# Patient Record
Sex: Female | Born: 1935 | Race: Black or African American | Hispanic: No | Marital: Single
Health system: Southern US, Community
[De-identification: ages and names within clinical notes are randomized; demographics above are authoritative.]

## PROBLEM LIST (undated history)

## (undated) DIAGNOSIS — I10 Essential (primary) hypertension: Secondary | ICD-10-CM

## (undated) DIAGNOSIS — Z8719 Personal history of other diseases of the digestive system: Secondary | ICD-10-CM

## (undated) DIAGNOSIS — Z8711 Personal history of peptic ulcer disease: Secondary | ICD-10-CM

## (undated) HISTORY — PX: CARPAL TUNNEL RELEASE: SHX101

---

## 1998-11-08 ENCOUNTER — Other Ambulatory Visit: Admission: RE | Admit: 1998-11-08 | Discharge: 1998-11-08 | Payer: Self-pay | Admitting: Otolaryngology

## 1999-04-20 ENCOUNTER — Emergency Department (HOSPITAL_COMMUNITY): Admission: EM | Admit: 1999-04-20 | Discharge: 1999-04-20 | Payer: Self-pay | Admitting: Emergency Medicine

## 2003-09-30 ENCOUNTER — Emergency Department (HOSPITAL_COMMUNITY): Admission: EM | Admit: 2003-09-30 | Discharge: 2003-10-01 | Payer: Self-pay | Admitting: Emergency Medicine

## 2003-10-10 ENCOUNTER — Ambulatory Visit (HOSPITAL_COMMUNITY): Admission: RE | Admit: 2003-10-10 | Discharge: 2003-10-10 | Payer: Self-pay | Admitting: Chiropractic Medicine

## 2003-10-10 ENCOUNTER — Encounter: Payer: Self-pay | Admitting: Chiropractic Medicine

## 2004-01-29 ENCOUNTER — Emergency Department (HOSPITAL_COMMUNITY): Admission: EM | Admit: 2004-01-29 | Discharge: 2004-01-29 | Payer: Self-pay | Admitting: Family Medicine

## 2004-02-07 ENCOUNTER — Encounter: Admission: RE | Admit: 2004-02-07 | Discharge: 2004-02-07 | Payer: Self-pay | Admitting: Nephrology

## 2004-10-24 ENCOUNTER — Emergency Department (HOSPITAL_COMMUNITY): Admission: EM | Admit: 2004-10-24 | Discharge: 2004-10-24 | Payer: Self-pay | Admitting: Family Medicine

## 2005-11-20 ENCOUNTER — Emergency Department (HOSPITAL_COMMUNITY): Admission: EM | Admit: 2005-11-20 | Discharge: 2005-11-20 | Payer: Self-pay | Admitting: Family Medicine

## 2006-01-12 ENCOUNTER — Emergency Department (HOSPITAL_COMMUNITY): Admission: EM | Admit: 2006-01-12 | Discharge: 2006-01-12 | Payer: Self-pay | Admitting: Family Medicine

## 2007-09-07 ENCOUNTER — Emergency Department (HOSPITAL_COMMUNITY): Admission: EM | Admit: 2007-09-07 | Discharge: 2007-09-07 | Payer: Self-pay | Admitting: Emergency Medicine

## 2009-07-20 ENCOUNTER — Emergency Department (HOSPITAL_COMMUNITY): Admission: EM | Admit: 2009-07-20 | Discharge: 2009-07-21 | Payer: Self-pay | Admitting: Emergency Medicine

## 2009-11-15 ENCOUNTER — Emergency Department (HOSPITAL_COMMUNITY): Admission: EM | Admit: 2009-11-15 | Discharge: 2009-11-16 | Payer: Self-pay | Admitting: Emergency Medicine

## 2011-04-02 LAB — CBC
Hemoglobin: 12.9 g/dL (ref 12.0–15.0)
MCHC: 33.6 g/dL (ref 30.0–36.0)
RDW: 12.7 % (ref 11.5–15.5)

## 2011-04-02 LAB — COMPREHENSIVE METABOLIC PANEL
ALT: 14 U/L (ref 0–35)
Calcium: 9.3 mg/dL (ref 8.4–10.5)
Creatinine, Ser: 0.93 mg/dL (ref 0.4–1.2)
GFR calc Af Amer: >60 mL/min (ref >60)
Glucose, Bld: 137 mg/dL — ABNORMAL HIGH (ref 70–99)
Sodium: 139 mEq/L (ref 135–145)
Total Protein: 7.9 g/dL (ref 6.0–8.3)

## 2011-04-02 LAB — URINE MICROSCOPIC-ADD ON

## 2011-04-02 LAB — DIFFERENTIAL
Eosinophils Absolute: 0.1 10*3/uL (ref 0.0–0.7)
Lymphocytes Relative: 5 % — ABNORMAL LOW (ref 12–46)
Lymphs Abs: 0.4 10*3/uL — ABNORMAL LOW (ref 0.7–4.0)
Monocytes Relative: 3 % (ref 3–12)
Neutrophils Relative %: 91 % — ABNORMAL HIGH (ref 43–77)

## 2011-04-02 LAB — URINALYSIS, ROUTINE W REFLEX MICROSCOPIC
Glucose, UA: NEGATIVE mg/dL
Leukocytes, UA: NEGATIVE
Protein, ur: NEGATIVE mg/dL
Specific Gravity, Urine: 1.017 (ref 1.005–1.030)
pH: 5 (ref 5.0–8.0)

## 2011-04-06 LAB — URINALYSIS, ROUTINE W REFLEX MICROSCOPIC
Ketones, ur: NEGATIVE mg/dL
Protein, ur: NEGATIVE mg/dL
Urobilinogen, UA: 0.2 mg/dL (ref 0.0–1.0)

## 2011-04-06 LAB — DIFFERENTIAL
Basophils Relative: 0 % (ref 0–1)
Eosinophils Absolute: 0 10*3/uL (ref 0.0–0.7)
Lymphs Abs: 0.7 10*3/uL (ref 0.7–4.0)
Monocytes Absolute: 0.2 10*3/uL (ref 0.1–1.0)
Monocytes Relative: 4 % (ref 3–12)
Neutro Abs: 4.1 10*3/uL (ref 1.7–7.7)

## 2011-04-06 LAB — POCT I-STAT, CHEM 8
Calcium, Ion: 1.13 mmol/L (ref 1.12–1.32)
Chloride: 102 mEq/L (ref 96–112)
Creatinine, Ser: 1.1 mg/dL (ref 0.4–1.2)
Glucose, Bld: 106 mg/dL — ABNORMAL HIGH (ref 70–99)
HCT: 34 % — ABNORMAL LOW (ref 36.0–46.0)
Potassium: 3.7 mEq/L (ref 3.5–5.1)

## 2011-04-06 LAB — CBC
Hemoglobin: 11.4 g/dL — ABNORMAL LOW (ref 12.0–15.0)
MCHC: 33.6 g/dL (ref 30.0–36.0)
MCV: 88.9 fL (ref 78.0–100.0)
RBC: 3.82 MIL/uL — ABNORMAL LOW (ref 3.87–5.11)
WBC: 5.1 10*3/uL (ref 4.0–10.5)

## 2011-04-06 LAB — URINE MICROSCOPIC-ADD ON

## 2012-05-14 ENCOUNTER — Encounter (HOSPITAL_COMMUNITY): Payer: Self-pay | Admitting: Emergency Medicine

## 2012-05-14 ENCOUNTER — Emergency Department (INDEPENDENT_AMBULATORY_CARE_PROVIDER_SITE_OTHER)
Admission: EM | Admit: 2012-05-14 | Discharge: 2012-05-14 | Disposition: A | Discharge status: Home / Self Care | Payer: Medicare Other | Source: Home / Self Care | Attending: Family Medicine | Admitting: Family Medicine

## 2012-05-14 DIAGNOSIS — S91209A Unspecified open wound of unspecified toe(s) with damage to nail, initial encounter: Secondary | ICD-10-CM

## 2012-05-14 DIAGNOSIS — I10 Essential (primary) hypertension: Secondary | ICD-10-CM

## 2012-05-14 DIAGNOSIS — S91109A Unspecified open wound of unspecified toe(s) without damage to nail, initial encounter: Secondary | ICD-10-CM

## 2012-05-14 LAB — POCT I-STAT, CHEM 8
BUN: 19 mg/dL (ref 6–23)
Calcium, Ion: 1.22 mmol/L (ref 1.12–1.32)
Chloride: 102 mEq/L (ref 96–112)
Glucose, Bld: 94 mg/dL (ref 70–99)
TCO2: 30 mmol/L (ref 0–100)

## 2012-05-14 MED ORDER — HYDROCHLOROTHIAZIDE 12.5 MG PO TABS: 12.5000 mg | ORAL_TABLET | Freq: Every day | ORAL | Status: DC

## 2012-05-14 MED ORDER — ACETAMINOPHEN-CODEINE #3 300-30 MG PO TABS: 1.0000 | ORAL_TABLET | Freq: Four times a day (QID) | ORAL | Status: AC | PRN

## 2012-05-14 MED ORDER — DOXYCYCLINE HYCLATE 100 MG PO CAPS: 100.0000 mg | ORAL_CAPSULE | Freq: Two times a day (BID) | ORAL | Status: AC

## 2012-05-14 NOTE — ED Notes (Signed)
First set of vitals obtained by [employer] student

## 2012-05-14 NOTE — ED Notes (Signed)
Pt caught her toenail on a door jam and it was almost pulled off. No c/o pain. Pt BP is elevated, she states this is a chronic problem but doesn't take anything for it.

## 2012-05-14 NOTE — ED Provider Notes (Signed)
History     CSN: [id number]  Arrival date & time 05/14/12  4098   First MD Initiated Contact with Patient 05/14/12 1938      Chief Complaint  Patient presents with  . Toe Injury    (Consider location/radiation/quality/duration/timing/severity/associated sxs/prior treatment) HPI Comments: 78 year old female with no significant past medical history. Here complaining of pain and bleeding from left toenail. She has thick and deformed toenails and hit her left toenail against the door earlier today causing bleeding and pain. Blood pressure found elevated here she has been told before her blood pressure is high has never taken any medication last time she saw her primary care provider was one year ago denies headache or visual changes. Denies chest pain or shortness of breath. No difficulties with gait or balance no extremity weakness or paresthesias. No leg swelling or PND.    History reviewed. No pertinent past medical history.  History reviewed. No pertinent past surgical history.  History reviewed. No pertinent family history.  History  Substance Use Topics  . Smoking status: Never Smoker   . Smokeless tobacco: Not on file  . Alcohol Use: No    OB History    Grav Para Term Preterm Abortions TAB SAB Ect Mult Living                  Review of Systems  Constitutional: Negative for fever, chills and diaphoresis.       10 systems reviewed and  pertinent negative and positive symptoms and as per HPI.     Respiratory: Negative for cough, chest tightness and shortness of breath.   Cardiovascular: Negative for chest pain, palpitations and leg swelling.  Gastrointestinal: Negative for nausea and abdominal pain.  Musculoskeletal: Negative for back pain.  Skin: Negative for rash.       Left big toenail avulsion as per HPI  Neurological: Negative for dizziness, seizures, syncope, weakness, numbness and headaches.  All other systems reviewed and are negative.    Allergies    Aspirin  Home Medications   Current Outpatient Rx  Name Route Sig Dispense Refill  . ACETAMINOPHEN-CODEINE #3 300-30 MG PO TABS Oral Take 1-2 tablets by mouth every 6 (six) hours as needed for pain. 15 tablet 0  . DOXYCYCLINE HYCLATE 100 MG PO CAPS Oral Take 1 capsule (100 mg total) by mouth 2 (two) times daily. 14 capsule 0  . HYDROCHLOROTHIAZIDE 12.5 MG PO TABS Oral Take 1 tablet (12.5 mg total) by mouth daily. 30 tablet 0    BP 192/71  Pulse 79  Temp(Src) 98.1 F (36.7 C) (Oral)  Resp 12  SpO2 100%  Physical Exam  Nursing note and vitals reviewed. Constitutional: She is oriented to person, place, and time. She appears well-developed and well-nourished. No distress.  HENT:  Head: Normocephalic and atraumatic.  Mouth/Throat: Oropharynx is clear and moist.  Eyes: Conjunctivae and EOM are normal. Pupils are equal, round, and reactive to light. No scleral icterus.  Neck: Neck supple. No JVD present. No thyromegaly present.  Cardiovascular: Normal rate, regular rhythm, normal heart sounds and intact distal pulses.   Pulmonary/Chest: Effort normal and breath sounds normal. No respiratory distress. She has no wheezes. She has no rales.  Abdominal: Soft. There is no tenderness.  Neurological: She is alert and oriented to person, place, and time.  Skin: No rash noted. She is not diaphoretic.       Partially avulsed left toe nail with mild bleeding.      ED Course  NAIL REMOVAL Performed by: Sharin Grave Authorized by: Sharin Grave Consent: Verbal consent obtained. Consent given by: patient Patient understanding: patient states understanding of the procedure being performed Patient consent: the patient's understanding of the procedure matches consent given Location: left foot Anesthesia: digital block Local anesthetic: lidocaine 2% without epinephrine Anesthetic total: 3 ml Preparation: skin prepped with Betadine and sterile field established Amount removed:  complete Nail bed sutured: no Dressing: antibiotic ointment and dressing applied Patient tolerance: Patient tolerated the procedure well with no immediate complications.   (including critical care time)   Labs Reviewed  POCT I-STAT, CHEM 8   No results found.   1. Hypertension   2. Nail avulsion, toe       MDM  Partially avulsed left toe nail removed. Uncontrolled hypertension, asymptomatic. Pt desires to start treatment. I stat with normal electrolytes. Stable borderline creatinine and normal but rising BUN compared with I- stat 2 years ago.  EKG: with signs of LVH, NSR, normal rate in 70's and no acute ischemic changes. Started low dose HCTZ 12.5 mg daily. Asked to follow up with PCP during next week for BP monitoring, explained she will need fasting labs for cholesterol screening etc.  Asked to return in 2-3 days for wound recheck.        Sharin Grave, MD 05/15/12 0530

## 2012-05-14 NOTE — Discharge Instructions (Signed)
Keep wound clean and dry. Can remove dressing tomorrow. Can apply antibiotic ointment before covering. Can follow handout instructions. Take the prescribed medications as instructed. Be aware that Tylenol #3 can make you feel drowsy putting your risk for falls and he should not drive after taking this medication. Return in 48 -72 hours for recheck. Return earlier if swelling pain or drainage. You need to see your primary care provider next week for blood pressure monitoring.

## 2012-05-17 ENCOUNTER — Emergency Department (INDEPENDENT_AMBULATORY_CARE_PROVIDER_SITE_OTHER)
Admission: EM | Admit: 2012-05-17 | Discharge: 2012-05-17 | Disposition: A | Discharge status: Home / Self Care | Payer: Medicare Other | Source: Home / Self Care | Attending: Family Medicine | Admitting: Family Medicine

## 2012-05-17 ENCOUNTER — Encounter (HOSPITAL_COMMUNITY): Payer: Self-pay | Admitting: Emergency Medicine

## 2012-05-17 DIAGNOSIS — Z4800 Encounter for change or removal of nonsurgical wound dressing: Secondary | ICD-10-CM

## 2012-05-17 DIAGNOSIS — IMO0001 Reserved for inherently not codable concepts without codable children: Secondary | ICD-10-CM

## 2012-05-17 HISTORY — DX: Essential (primary) hypertension: I10

## 2012-05-17 HISTORY — DX: Personal history of peptic ulcer disease: Z87.11

## 2012-05-17 HISTORY — DX: Personal history of other diseases of the digestive system: Z87.19

## 2012-05-17 NOTE — Discharge Instructions (Signed)
Continue to soak and dressing changes. Follow up if any complications.

## 2012-05-17 NOTE — ED Provider Notes (Signed)
History     CSN: [id number]  Arrival date & time 05/17/12  1191   First MD Initiated Contact with Patient 05/17/12 [phone]      Chief Complaint  Patient presents with  . Wound Check    (Consider location/radiation/quality/duration/timing/severity/associated sxs/prior treatment) HPI Comments: Patient here for a wound check . Had her left great toe nail removed Friday. Taking her antibiotic. States feels much better. No complaints.   The history is provided by the patient.    Past Medical History  Diagnosis Date  . Hypertension   . History of stomach ulcers     History reviewed. No pertinent past surgical history.  History reviewed. No pertinent family history.  History  Substance Use Topics  . Smoking status: Never Smoker   . Smokeless tobacco: Not on file  . Alcohol Use: No    OB History    Grav Para Term Preterm Abortions TAB SAB Ect Mult Living                  Review of Systems  Constitutional: Negative.   Respiratory: Negative.   Cardiovascular: Negative.     Allergies  Aspirin  Home Medications   Current Outpatient Rx  Name Route Sig Dispense Refill  . ACETAMINOPHEN-CODEINE #3 300-30 MG PO TABS Oral Take 1-2 tablets by mouth every 6 (six) hours as needed for pain. 15 tablet 0  . DOXYCYCLINE HYCLATE 100 MG PO CAPS Oral Take 1 capsule (100 mg total) by mouth 2 (two) times daily. 14 capsule 0  . HYDROCHLOROTHIAZIDE 12.5 MG PO TABS Oral Take 1 tablet (12.5 mg total) by mouth daily. 30 tablet 0    BP 184/96  Pulse 96  Temp(Src) 98.9 F (37.2 C) (Oral)  Resp 14  SpO2 99%  Physical Exam  Nursing note and vitals reviewed. Constitutional: She appears well-developed and well-nourished.       bp still up . Is taking her hctz since fri.  HENT:  Head: Normocephalic and atraumatic.  Cardiovascular: Normal rate.   Pulmonary/Chest: Effort normal and breath sounds normal.  Musculoskeletal:       eval of the left toe reveals the nail to be absent. No  swelling. No erythema. rom intact. Wound redressed with neosporin ointment.     ED Course  Procedures (including critical care time)  Labs Reviewed - No data to display No results found.   1. Wound check, dressing change       MDM          Randa Spike, MD 05/17/12 1023

## 2012-05-17 NOTE — ED Notes (Signed)
Toenail removed on Friday- here for recheck

## 2014-01-06 ENCOUNTER — Ambulatory Visit: Payer: Self-pay | Admitting: Podiatrist

## 2014-01-18 ENCOUNTER — Ambulatory Visit: Payer: Self-pay | Admitting: Podiatrist

## 2014-02-03 ENCOUNTER — Ambulatory Visit: Payer: Self-pay | Admitting: Podiatrist

## 2014-02-10 ENCOUNTER — Ambulatory Visit: Payer: Self-pay | Admitting: Podiatrist

## 2014-05-16 ENCOUNTER — Encounter: Payer: Self-pay | Admitting: Podiatry

## 2014-05-16 ENCOUNTER — Ambulatory Visit (INDEPENDENT_AMBULATORY_CARE_PROVIDER_SITE_OTHER): Payer: Medicare Other | Admitting: Podiatry

## 2014-05-16 VITALS — BP 121/85 | HR 84 | Resp 18 | Ht 66.0 in | Wt 128.0 lb

## 2014-05-16 DIAGNOSIS — M79609 Pain in unspecified limb: Secondary | ICD-10-CM

## 2014-05-16 DIAGNOSIS — B351 Tinea unguium: Secondary | ICD-10-CM

## 2014-05-16 NOTE — Progress Notes (Signed)
   Subjective:    Patient ID: Sharon Coleman, female    DOB: 07-Aug-1931, 80 y.o.   MRN: [id number]  HPI Comments: Problems with  These toenails all my life. They are thick painful and discolored .      Review of Systems  All other systems reviewed and are negative.      Objective:   Physical Exam have reviewed her past medical history medications allergies surgeries social history and review of systems. Pulses are strongly palpable neurologic sensorium is intact per Semmes-Weinstein monofilaments deep tendon reflexes are intact bilateral muscle strength is 5 over 5 dorsiflexors plantar flexors inverters everters onto the musculature is intact. Orthopedic evaluation demonstrates hammertoe deformities with distal clavi to the third digit bilateral. Cutaneous evaluation demonstrates supple well hydrated cutis nails are thick yellow dystrophic onychomycotic distal clavi third digit bilateral. No signs of infection. Thick yellow dystrophic lytic mycotic nails with painful on palpation and debridement.        Assessment & Plan:  Assessment: Hammertoe deformities with distal clavi third bilateral. Pain in limb secondary to onychomycosis 1 through 5 bilateral.  Plan: Debridement of all reactive hyperkeratosis. Debridement of nails in thickness and length as a covered service secondary to pain. Placed padding beneath the toes.

## 2014-07-04 ENCOUNTER — Encounter: Payer: Self-pay | Admitting: Podiatry

## 2014-07-04 ENCOUNTER — Ambulatory Visit (INDEPENDENT_AMBULATORY_CARE_PROVIDER_SITE_OTHER): Payer: Medicare Other | Admitting: Podiatry

## 2014-07-04 VITALS — BP 170/80 | HR 78 | Resp 16

## 2014-07-04 DIAGNOSIS — M779 Enthesopathy, unspecified: Principal | ICD-10-CM

## 2014-07-04 DIAGNOSIS — M778 Other enthesopathies, not elsewhere classified: Secondary | ICD-10-CM

## 2014-07-04 DIAGNOSIS — M7750 Other enthesopathy of unspecified foot: Secondary | ICD-10-CM

## 2014-07-04 NOTE — Progress Notes (Signed)
She presents today still complaining of her third toes bilateral.  Objective: Vital signs are stable she is alert and oriented x3. Pulses are palpable bilateral. Hammertoe deformity results and a distal clavus third digit bilateral. No ulcerations are noted. She does have pain on palpation of the PIPJ and DIPJ. This is associated with osteoarthritis.  Assessment: Capsulitis with hammertoe deformities and distal clavus third digit bilateral osteoarthritis also had a hammertoe deformity third digit bilateral.  Plan: Debridement reactive hyperkeratosis distal aspect third toe bilateral also injected with dexamethasone and local anesthetic third digit bilateral.

## 2014-07-12 ENCOUNTER — Telehealth: Payer: Self-pay | Admitting: *Deleted

## 2014-07-12 NOTE — Telephone Encounter (Signed)
I'm calling regarding my mother.  She was in there last week.   She was given injections in her feet and they don't seem to be helping.  Is there something else that can be done?  Should she have x-rays taken of her feet to see if there's a problem? I told him his mother has Osteoarthritis in her feet.  The only option to correct that is surgery and I don't know if Dr. Irving ShowsEgerton will okay it.  He asked why.  I told him it depends on his mother's health, is she stable, able to get around.  He stated yes, my mother is in perfect health.  She works a third shift job.  I told him she can come in for a consultation and see what Dr. Irving ShowsEgerton suggests.  He stated that will be good.  I transferred him to a scheduler to make an appointment.

## 2014-07-27 ENCOUNTER — Encounter: Payer: Self-pay | Admitting: Podiatry

## 2014-07-27 ENCOUNTER — Encounter: Payer: Self-pay | Admitting: *Deleted

## 2014-07-27 ENCOUNTER — Ambulatory Visit (INDEPENDENT_AMBULATORY_CARE_PROVIDER_SITE_OTHER): Payer: Medicare Other | Admitting: Podiatry

## 2014-07-27 VITALS — BP 154/68 | HR 80 | Resp 16

## 2014-07-27 DIAGNOSIS — M779 Enthesopathy, unspecified: Principal | ICD-10-CM

## 2014-07-27 DIAGNOSIS — B351 Tinea unguium: Secondary | ICD-10-CM

## 2014-07-27 DIAGNOSIS — L608 Other nail disorders: Secondary | ICD-10-CM

## 2014-07-27 DIAGNOSIS — M7750 Other enthesopathy of unspecified foot: Secondary | ICD-10-CM

## 2014-07-27 DIAGNOSIS — M778 Other enthesopathies, not elsewhere classified: Secondary | ICD-10-CM

## 2014-07-27 NOTE — Progress Notes (Signed)
She presents today for followup of her hammertoes third digits bilaterally. She also concerned about thickness of her toenails being painful.  Objective: Vital signs are stable she is alert and oriented x3. No pain on palpation third toe of the left foot however third toes right foot is exquisitely painful on palpation. Is no erythema edema cellulitis drainage or odor. Distal clavus is noted. Her nails are thick yellow dystrophic possibly mycotic there does not appear to be fungal infection to the skin.  Assessment: Hammertoe deformities with capsulitis third metatarsophalangeal joint of the right foot. Hammertoe deformity also noted on the left foot without pain. Nail dystrophy 1 through 5 bilaterally.  Plan: Samples of the nail was taken today. I also injected the third metatarsophalangeal joint area with dexamethasone and local anesthetic. Discussed appropriate shoe gear stretching exercises and ice therapy.

## 2014-07-27 NOTE — Progress Notes (Signed)
[facility]

## 2014-08-15 ENCOUNTER — Encounter: Payer: Self-pay | Admitting: Podiatry

## 2014-08-17 ENCOUNTER — Ambulatory Visit: Payer: Medicare Other | Admitting: Podiatry

## 2014-09-05 ENCOUNTER — Encounter: Payer: Self-pay | Admitting: Podiatry

## 2014-09-05 ENCOUNTER — Ambulatory Visit (INDEPENDENT_AMBULATORY_CARE_PROVIDER_SITE_OTHER): Payer: Medicare Other | Admitting: Podiatry

## 2014-09-05 ENCOUNTER — Ambulatory Visit (INDEPENDENT_AMBULATORY_CARE_PROVIDER_SITE_OTHER): Payer: Medicare Other

## 2014-09-05 DIAGNOSIS — M2040 Other hammer toe(s) (acquired), unspecified foot: Secondary | ICD-10-CM

## 2014-09-05 DIAGNOSIS — M2041 Other hammer toe(s) (acquired), right foot: Secondary | ICD-10-CM

## 2014-09-05 DIAGNOSIS — Z79899 Other long term (current) drug therapy: Secondary | ICD-10-CM

## 2014-09-05 DIAGNOSIS — Q828 Other specified congenital malformations of skin: Secondary | ICD-10-CM

## 2014-09-05 LAB — HEPATIC FUNCTION PANEL
ALT: 18 U/L (ref 0–35)
AST: 35 U/L (ref 0–37)
Albumin: 4.4 g/dL (ref 3.5–5.2)
Alkaline Phosphatase: 67 U/L (ref 39–117)
BILIRUBIN DIRECT: 0.1 mg/dL (ref 0.0–0.3)
BILIRUBIN INDIRECT: 0.4 mg/dL (ref 0.2–1.2)
Total Bilirubin: 0.5 mg/dL (ref 0.2–1.2)
Total Protein: 7.1 g/dL (ref 6.0–8.3)

## 2014-09-05 LAB — CBC WITH DIFFERENTIAL/PLATELET
Basophils Absolute: 0 10*3/uL (ref 0.0–0.1)
Basophils Relative: 0 % (ref 0–1)
EOS ABS: 0.1 10*3/uL (ref 0.0–0.7)
EOS PCT: 4 % (ref 0–5)
HEMATOCRIT: 30 % — AB (ref 36.0–46.0)
HEMOGLOBIN: 10.3 g/dL — AB (ref 12.0–15.0)
LYMPHS ABS: 1 10*3/uL (ref 0.7–4.0)
Lymphocytes Relative: 28 % (ref 12–46)
MCH: 29.3 pg (ref 26.0–34.0)
MCHC: 34.3 g/dL (ref 30.0–36.0)
MCV: 85.5 fL (ref 78.0–100.0)
MONOS PCT: 7 % (ref 3–12)
Monocytes Absolute: 0.3 10*3/uL (ref 0.1–1.0)
Neutro Abs: 2.2 10*3/uL (ref 1.7–7.7)
Neutrophils Relative %: 61 % (ref 43–77)
Platelets: 204 10*3/uL (ref 150–400)
RBC: 3.51 MIL/uL — AB (ref 3.87–5.11)
RDW: 14 % (ref 11.5–15.5)
WBC: 3.6 10*3/uL — ABNORMAL LOW (ref 4.0–10.5)

## 2014-09-05 MED ORDER — TERBINAFINE HCL 250 MG PO TABS: 250.0000 mg | ORAL_TABLET | Freq: Every day | ORAL | Status: DC

## 2014-09-05 NOTE — Progress Notes (Signed)
She presents today chief complaint of pain to the third digit of the right foot. As well as to review her positive fungal culture.  Objective: Vital signs are stable she is alert and oriented x3. Positive fungal culture. Hammertoe deformity with severe bunion deformity right foot. Distal clavus third digit right foot. This is consistent with her hammertoe deformities.  Assessment: Painful porokeratotic lesion her distal clavus third digit right foot secondary hammertoe deformities. Also positive onychomycosis.  Plan: Discussed etiology pathology conservative versus surgical therapies. Started her on Lamisil 250 mg tablets 1 by mouth daily #30. Should she have questions or concerns she will notify us immediately we did discuss the pros and cons of Lamisil therapy today we also send her for liver profile and CBC. Should this come back abnormal I will notify her immediately. Otherwise I debrided the reactive hyperkeratosis today discuss the need for surgical intervention. We will discuss surgery for the third toe consisting of an arthroplasty PIPJ and DIPJ next visit.

## 2014-09-08 ENCOUNTER — Telehealth: Payer: Self-pay | Admitting: *Deleted

## 2014-09-08 NOTE — Telephone Encounter (Signed)
Message copied by Bing Ree on Fri Sep 08, 2014  9:07 AM ------      Message from: Ernestene Kiel T      Created: Wed Sep 06, 2014 12:03 PM       Blood work looks good. Continue medication. ------

## 2014-09-08 NOTE — Telephone Encounter (Signed)
Blood work looks good continue with medication  

## 2014-10-03 ENCOUNTER — Ambulatory Visit: Payer: Medicare Other | Admitting: Podiatry

## 2014-10-06 ENCOUNTER — Emergency Department (HOSPITAL_COMMUNITY): Payer: Medicare Other

## 2014-10-06 ENCOUNTER — Emergency Department (HOSPITAL_COMMUNITY)
Admission: EM | Admit: 2014-10-06 | Discharge: 2014-10-06 | Disposition: A | Discharge status: Home / Self Care | Payer: Medicare Other | Attending: Emergency Medicine | Admitting: Emergency Medicine

## 2014-10-06 ENCOUNTER — Encounter (HOSPITAL_COMMUNITY): Payer: Self-pay | Admitting: Emergency Medicine

## 2014-10-06 DIAGNOSIS — Z8719 Personal history of other diseases of the digestive system: Secondary | ICD-10-CM | POA: Insufficient documentation

## 2014-10-06 DIAGNOSIS — M2011 Hallux valgus (acquired), right foot: Secondary | ICD-10-CM | POA: Insufficient documentation

## 2014-10-06 DIAGNOSIS — S93502A Unspecified sprain of left great toe, initial encounter: Secondary | ICD-10-CM | POA: Insufficient documentation

## 2014-10-06 DIAGNOSIS — Y9289 Other specified places as the place of occurrence of the external cause: Secondary | ICD-10-CM | POA: Insufficient documentation

## 2014-10-06 DIAGNOSIS — W228XXA Striking against or struck by other objects, initial encounter: Secondary | ICD-10-CM | POA: Insufficient documentation

## 2014-10-06 DIAGNOSIS — Z79899 Other long term (current) drug therapy: Secondary | ICD-10-CM | POA: Diagnosis not present

## 2014-10-06 DIAGNOSIS — Y9301 Activity, walking, marching and hiking: Secondary | ICD-10-CM | POA: Diagnosis not present

## 2014-10-06 DIAGNOSIS — Z880 Allergy status to penicillin: Secondary | ICD-10-CM | POA: Insufficient documentation

## 2014-10-06 DIAGNOSIS — S99922A Unspecified injury of left foot, initial encounter: Secondary | ICD-10-CM | POA: Diagnosis present

## 2014-10-06 DIAGNOSIS — I10 Essential (primary) hypertension: Secondary | ICD-10-CM | POA: Insufficient documentation

## 2014-10-06 DIAGNOSIS — S96912A Strain of unspecified muscle and tendon at ankle and foot level, left foot, initial encounter: Secondary | ICD-10-CM

## 2014-10-06 MED ORDER — HYDROCODONE-ACETAMINOPHEN 5-325 MG PO TABS: 1.0000 | ORAL_TABLET | Freq: Four times a day (QID) | ORAL | Status: AC | PRN

## 2014-10-06 NOTE — ED Notes (Addendum)
Very active 80 y/o only w/ hx of htn c/o pain to rt great bunion after hitting it on a step this morning.  Still works full time, 3rd shift at [employer].

## 2014-10-06 NOTE — Discharge Instructions (Signed)
Foot Sprain The muscles and cord like structures which attach muscle to bone (tendons) that surround the feet are made up of units. A foot sprain can occur at the weakest spot in any of these units. This condition is most often caused by injury to or overuse of the foot, as from playing contact sports, or aggravating a previous injury, or from poor conditioning, or obesity. SYMPTOMS  Pain with movement of the foot.  Tenderness and swelling at the injury site.  Loss of strength is present in moderate or severe sprains. THE THREE GRADES OR SEVERITY OF FOOT SPRAIN ARE:  Mild (Grade I): Slightly pulled muscle without tearing of muscle or tendon fibers or loss of strength.  Moderate (Grade II): Tearing of fibers in a muscle, tendon, or at the attachment to bone, with small decrease in strength.  Severe (Grade III): Rupture of the muscle-tendon-bone attachment, with separation of fibers. Severe sprain requires surgical repair. Often repeating (chronic) sprains are caused by overuse. Sudden (acute) sprains are caused by direct injury or over-use. DIAGNOSIS  Diagnosis of this condition is usually by your own observation. If problems continue, a caregiver may be required for further evaluation and treatment. X-rays may be required to make sure there are not breaks in the bones (fractures) present. Continued problems may require physical therapy for treatment. PREVENTION  Use strength and conditioning exercises appropriate for your sport.  Warm up properly prior to working out.  Use athletic shoes that are made for the sport you are participating in.  Allow adequate time for healing. Early return to activities makes repeat injury more likely, and can lead to an unstable arthritic foot that can result in prolonged disability. Mild sprains generally heal in 3 to 10 days, with moderate and severe sprains taking 2 to 10 weeks. Your caregiver can help you determine the proper time required for  healing. HOME CARE INSTRUCTIONS   Apply ice to the injury for 15-20 minutes, 03-04 times per day. Put the ice in a plastic bag and place a towel between the bag of ice and your skin.  An elastic wrap (like an Ace bandage) may be used to keep swelling down.  Keep foot above the level of the heart, or at least raised on a footstool, when swelling and pain are present.  Try to avoid use other than gentle range of motion while the foot is painful. Do not resume use until instructed by your caregiver. Then begin use gradually, not increasing use to the point of pain. If pain does develop, decrease use and continue the above measures, gradually increasing activities that do not cause discomfort, until you gradually achieve normal use.  Use crutches if and as instructed, and for the length of time instructed.  Keep injured foot and ankle wrapped between treatments.  Massage foot and ankle for comfort and to keep swelling down. Massage from the toes up towards the knee.  Only take over-the-counter or prescription medicines for pain, discomfort, or fever as directed by your caregiver. SEEK IMMEDIATE MEDICAL CARE IF:   Your pain and swelling increase, or pain is not controlled with medications.  You have loss of feeling in your foot or your foot turns cold or blue.  You develop new, unexplained symptoms, or an increase of the symptoms that brought you to your caregiver. MAKE SURE YOU:   Understand these instructions.  Will watch your condition.  Will get help right away if you are not doing well or get worse. Document Released:   06/06/2002 Document Revised: 03/08/2012 Document Reviewed: 08/03/2008 Exit[employer] Patient Information 2015 [employer]. This information is not intended to replace advice given to you by your health care provider. Make sure you discuss any questions you have with your health care provider.  

## 2014-10-06 NOTE — ED Provider Notes (Signed)
CSN: [id number]     Arrival date & time 10/06/14  1529 History  This chart was scribed for non-physician practitioner, Teressa LowerVrinda Dio Giller, NP working with Raeford RazorStephen Kohut, MD, by Jarvis Morganaylor Ferguson, ED Scribe. This patient was seen in room WTR7/WTR7 and the patient's care was started at 3:59 PM.    Chief Complaint  Patient presents with  . Toe Pain     The history is provided by the patient. No language interpreter was used.   HPI Comments: Sharon Coleman is a 80 y.o. female with a h/o HTN and stomach ulcers who presents to the Emergency Department complaining of of an injury to her right big toe. Pt states she injured the toe while walking up on a step this morning. She is having associated "10/10" pain in the toe. She denies any color change, swelling, numbness, weakness, or wound.    Past Medical History  Diagnosis Date  . Hypertension   . History of stomach ulcers    Past Surgical History  Procedure Laterality Date  . Carpal tunnel release     No family history on file. History  Substance Use Topics  . Smoking status: Never Smoker   . Smokeless tobacco: Never Used  . Alcohol Use: No   OB History   Grav Para Term Preterm Abortions TAB SAB Ect Mult Living                 Review of Systems  Musculoskeletal: Positive for arthralgias (right big toe). Negative for gait problem and joint swelling.  Skin: Negative for color change and wound.  Neurological: Negative for weakness and numbness.  All other systems reviewed and are negative.     Allergies  Aspirin and Penicillins  Home Medications   Prior to Admission medications   Medication Sig Start Date End Date Taking? Authorizing Provider  amLODipine (NORVASC) 5 MG tablet  05/12/14   Historical Provider, MD  hydrochlorothiazide (HYDRODIURIL) 12.5 MG tablet Take 1 tablet (12.5 mg total) by mouth daily. 05/14/12 05/14/13  Adlih Moreno-Coll, MD  losartan (COZAAR) 100 MG tablet  05/12/14   Historical Provider, MD   terbinafine (LAMISIL) 250 MG tablet Take 1 tablet (250 mg total) by mouth daily. 09/05/14   Max T Hyatt, DPM   Triage Vitals: BP 163/57  Pulse 79  Temp(Src) 98.7 F (37.1 C) (Oral)  Resp 18  SpO2 100%  Physical Exam  Nursing note and vitals reviewed. Constitutional: She is oriented to person, place, and time. She appears well-developed and well-nourished. No distress.  HENT:  Head: Normocephalic and atraumatic.  Eyes: Conjunctivae and EOM are normal.  Neck: Neck supple. No tracheal deviation present.  Cardiovascular: Normal rate.   Pulmonary/Chest: Effort normal. No respiratory distress.  Musculoskeletal: Normal range of motion.  Bunion noted right foot. Generalized tenderness of great toe w/o swelling or deformity noted  Neurological: She is alert and oriented to person, place, and time.  Skin: Skin is warm and dry.  Psychiatric: She has a normal mood and affect. Her behavior is normal.    ED Course  Procedures (including critical care time)  DIAGNOSTIC STUDIES: Oxygen Saturation is 100% on RA, normal by my interpretation.    COORDINATION OF CARE:    Labs Review Labs Reviewed - No data to display  Imaging Review Dg Toe Great Left  10/06/2014   CLINICAL DATA:  Toe injury with pain  EXAM: LEFT GREAT TOE  COMPARISON:  None.  FINDINGS: Degenerative changes the first MTP with hallux valgus deformity  and bunion.  Negative for fracture.  IMPRESSION: Negative for fracture.   Electronically Signed   By: Marlan Palauharles  Clark M.D.   On: 10/06/2014 17:06     EKG Interpretation None      MDM   Final diagnoses:  Strain of toe, left, initial encounter    No acute bony abnormality noted. Toes buddy taped for comfort. Pt given hydrocodone for pain as needed  I personally performed the services described in this documentation, which was scribed in my presence. The recorded information has been reviewed and is accurate.    Teressa LowerVrinda Cherryl Babin, NP 10/06/14 1722

## 2014-10-07 NOTE — ED Provider Notes (Signed)
Medical screening examination/treatment/procedure(s) were performed by non-physician practitioner and as supervising physician I was immediately available for consultation/collaboration.   EKG Interpretation None       Blanca Thornton, MD 10/07/14 1956 

## 2014-10-19 ENCOUNTER — Telehealth: Payer: Self-pay | Admitting: *Deleted

## 2014-10-19 NOTE — Telephone Encounter (Signed)
I called to see how the patient was doing.  She stated, "I had to stop taking the Lamisil because I broke out in a rash.  Dr. Loleta ChanceHill didn't know which of my medications was causing it so he had me to stop them all.  He told me I could start it back up in a couple of weeks.  I need to schedule an appointment with Dr. Al CorpusHyatt."  I told her I would let Dr. Al CorpusHyatt know and I transferred her to a scheduler.

## 2014-10-20 NOTE — Telephone Encounter (Signed)
She should follow up with me after she completed her lamisil if it is for her toenails. If she cannot take the lamisil then she should come in sooner.

## 2015-04-13 DIAGNOSIS — I10 Essential (primary) hypertension: Secondary | ICD-10-CM | POA: Diagnosis not present

## 2015-07-29 DIAGNOSIS — M545 Low back pain: Secondary | ICD-10-CM | POA: Diagnosis not present

## 2015-08-01 DIAGNOSIS — M545 Low back pain: Secondary | ICD-10-CM | POA: Diagnosis not present

## 2015-08-01 DIAGNOSIS — I10 Essential (primary) hypertension: Secondary | ICD-10-CM | POA: Diagnosis not present

## 2015-08-15 DIAGNOSIS — I10 Essential (primary) hypertension: Secondary | ICD-10-CM | POA: Diagnosis not present

## 2015-10-25 ENCOUNTER — Emergency Department (HOSPITAL_COMMUNITY)
Admission: EM | Admit: 2015-10-25 | Discharge: 2015-10-25 | Disposition: A | Discharge status: Home / Self Care | Payer: Commercial Managed Care - HMO | Attending: Emergency Medicine | Admitting: Emergency Medicine

## 2015-10-25 ENCOUNTER — Encounter (HOSPITAL_COMMUNITY): Payer: Self-pay | Admitting: Emergency Medicine

## 2015-10-25 DIAGNOSIS — Z8719 Personal history of other diseases of the digestive system: Secondary | ICD-10-CM | POA: Insufficient documentation

## 2015-10-25 DIAGNOSIS — Z880 Allergy status to penicillin: Secondary | ICD-10-CM | POA: Diagnosis not present

## 2015-10-25 DIAGNOSIS — Z79899 Other long term (current) drug therapy: Secondary | ICD-10-CM | POA: Insufficient documentation

## 2015-10-25 DIAGNOSIS — I10 Essential (primary) hypertension: Secondary | ICD-10-CM | POA: Insufficient documentation

## 2015-10-25 NOTE — ED Notes (Addendum)
Pt fell yesterday at work after tripping over a rug and hit back of head went to PCP yesterday and then again today to follow up and blood pressure was noted to be 194/104. Pt denies any headache or dizziness. Was sent here for high blood pressure. Off bp medicine for 1 week.

## 2015-10-25 NOTE — ED Notes (Signed)
Pt is here for evalaution of high blood pressure after taking herself off of her blood pressure medications for a week. Pt reports her MD told her to double up on her BP meds two months ago and it has been giving her leg cramps. Pt reports leg cramps have subsided since she took herself off of her medications. Pt reports today at work she fell and was seen at urgent care for the fall. Urgent care sent her here for the high blood pressure. PT is asymptomatic.

## 2015-10-25 NOTE — Discharge Instructions (Signed)
Take losartan (cozaar) as directed. Get your blood pressure rechecked at your doctor's appointment next week.

## 2015-10-25 NOTE — ED Provider Notes (Signed)
CSN: [id number]     Arrival date & time 10/25/15  1038 History   First MD Initiated Contact with Patient 10/25/15 1117     Chief Complaint  Patient presents with  . Hypertension     (Consider location/radiation/quality/duration/timing/severity/associated sxs/prior Treatment) HPI Patient tripped and fell yesterday struck her head and was seen in urgent care center was told that she had elevated blood pressure. She has not taken any for blood pressure for the past 1 week. She states that ever since amlodipine was increased to 10 mg 2 months ago she suffered muscle cramps and refuses to take any of her blood pressure medicine. She is presently asymptomatic no headache no chest pain or abdominal pain no focal numbness or weakness no other associated symptoms. She has appointment to see her primary care physician next week to adjust her blood pressure medication. Past Medical History  Diagnosis Date  . Hypertension   . History of stomach ulcers    Past Surgical History  Procedure Laterality Date  . Carpal tunnel release     No family history on file. Social History  Substance Use Topics  . Smoking status: Never Smoker   . Smokeless tobacco: Never Used  . Alcohol Use: No   OB History    No data available     Review of Systems  Constitutional: Negative.   HENT: Negative.   Respiratory: Negative.   Cardiovascular: Negative.   Gastrointestinal: Negative.   Musculoskeletal: Negative.   Skin: Negative.   Neurological: Negative.   Psychiatric/Behavioral: Negative.   All other systems reviewed and are negative.     Allergies  Aspirin and Penicillins  Home Medications   Prior to Admission medications   Medication Sig Start Date End Date Taking? Authorizing Provider  amLODipine (NORVASC) 5 MG tablet Take 5 mg by mouth at bedtime.   Yes Historical Provider, MD  HYDROcodone-acetaminophen (NORCO/VICODIN) 5-325 MG per tablet Take 1-2 tablets by mouth every 6 (six) hours as  needed. 10/06/14  Yes Teressa LowerVrinda Pickering, NP  losartan (COZAAR) 100 MG tablet Take 100 mg by mouth 2 (two) times daily.    Yes Historical Provider, MD   BP 198/77 mmHg  Pulse 72  Temp(Src) 98 F (36.7 C) (Oral)  Resp 12  Ht 5\' 6"  (1.676 m)  Wt 130 lb (58.968 kg)  BMI 20.99 kg/m2  SpO2 100% Physical Exam  Constitutional: She is oriented to person, place, and time. She appears well-developed and well-nourished.  HENT:  Head: Normocephalic and atraumatic.  Eyes: Conjunctivae are normal. Pupils are equal, round, and reactive to light.  Neck: Neck supple. No tracheal deviation present. No thyromegaly present.  Cardiovascular: Normal rate and regular rhythm.   No murmur heard. Pulmonary/Chest: Effort normal and breath sounds normal.  Abdominal: Soft. Bowel sounds are normal. She exhibits no distension. There is no tenderness.  Musculoskeletal: Normal range of motion. She exhibits no edema or tenderness.  Neurological: She is alert and oriented to person, place, and time. No cranial nerve deficit. Coordination normal.  Skin: Skin is warm and dry. No rash noted.  Psychiatric: She has a normal mood and affect.  Nursing note and vitals reviewed.   ED Course  Procedures (including critical care time) Labs Review Labs Reviewed - No data to display  Imaging Review No results found. I have personally reviewed and evaluated these images and lab results as part of my medical decision-making.   EKG Interpretation None      MDM  Patient feels that Norvasc  is contributing to her muscle cramps. Therefore I suggested she continue losartan as prescribed and get her blood pressure rechecked at her primary care physician's office next week she has an appointment scheduled for 6 days from now. No further emergent or urgent evaluation needed. She is asymptomatic Diagnosis hypertension Final diagnoses:  None        Doug Sou, MD 10/25/15 1242

## 2015-10-31 DIAGNOSIS — Z681 Body mass index (BMI) 19 or less, adult: Secondary | ICD-10-CM | POA: Diagnosis not present

## 2015-10-31 DIAGNOSIS — R51 Headache: Secondary | ICD-10-CM | POA: Diagnosis not present

## 2015-10-31 DIAGNOSIS — I10 Essential (primary) hypertension: Secondary | ICD-10-CM | POA: Diagnosis not present

## 2015-11-05 ENCOUNTER — Other Ambulatory Visit: Payer: Self-pay | Admitting: Family Medicine

## 2015-11-05 DIAGNOSIS — R519 Headache, unspecified: Secondary | ICD-10-CM

## 2015-11-05 DIAGNOSIS — R51 Headache: Principal | ICD-10-CM

## 2015-11-07 DIAGNOSIS — Z6820 Body mass index (BMI) 20.0-20.9, adult: Secondary | ICD-10-CM | POA: Diagnosis not present

## 2015-11-07 DIAGNOSIS — I10 Essential (primary) hypertension: Secondary | ICD-10-CM | POA: Diagnosis not present

## 2015-11-09 ENCOUNTER — Ambulatory Visit
Admission: RE | Admit: 2015-11-09 | Discharge: 2015-11-09 | Disposition: A | Discharge status: Home / Self Care | Payer: 59 | Source: Ambulatory Visit | Attending: Family Medicine | Admitting: Family Medicine

## 2015-11-09 DIAGNOSIS — R51 Headache: Secondary | ICD-10-CM | POA: Diagnosis not present

## 2015-11-09 DIAGNOSIS — R519 Headache, unspecified: Secondary | ICD-10-CM

## 2015-11-13 DIAGNOSIS — I10 Essential (primary) hypertension: Secondary | ICD-10-CM | POA: Diagnosis not present

## 2015-11-13 DIAGNOSIS — Z681 Body mass index (BMI) 19 or less, adult: Secondary | ICD-10-CM | POA: Diagnosis not present

## 2016-01-22 DIAGNOSIS — I10 Essential (primary) hypertension: Secondary | ICD-10-CM | POA: Diagnosis not present

## 2016-04-14 DIAGNOSIS — I10 Essential (primary) hypertension: Secondary | ICD-10-CM | POA: Diagnosis not present

## 2016-04-14 DIAGNOSIS — G4762 Sleep related leg cramps: Secondary | ICD-10-CM | POA: Diagnosis not present

## 2016-04-30 DIAGNOSIS — I10 Essential (primary) hypertension: Secondary | ICD-10-CM | POA: Diagnosis not present

## 2016-06-02 DIAGNOSIS — H10413 Chronic giant papillary conjunctivitis, bilateral: Secondary | ICD-10-CM | POA: Diagnosis not present

## 2016-06-02 DIAGNOSIS — H25812 Combined forms of age-related cataract, left eye: Secondary | ICD-10-CM | POA: Diagnosis not present

## 2016-06-02 DIAGNOSIS — H25811 Combined forms of age-related cataract, right eye: Secondary | ICD-10-CM | POA: Diagnosis not present

## 2016-06-23 DIAGNOSIS — I10 Essential (primary) hypertension: Secondary | ICD-10-CM | POA: Diagnosis not present

## 2016-06-23 DIAGNOSIS — R252 Cramp and spasm: Secondary | ICD-10-CM | POA: Diagnosis not present

## 2016-06-23 DIAGNOSIS — Z681 Body mass index (BMI) 19 or less, adult: Secondary | ICD-10-CM | POA: Diagnosis not present

## 2016-06-25 DIAGNOSIS — H52201 Unspecified astigmatism, right eye: Secondary | ICD-10-CM | POA: Diagnosis not present

## 2016-06-25 DIAGNOSIS — H25811 Combined forms of age-related cataract, right eye: Secondary | ICD-10-CM | POA: Diagnosis not present

## 2016-06-25 DIAGNOSIS — H2511 Age-related nuclear cataract, right eye: Secondary | ICD-10-CM | POA: Diagnosis not present

## 2016-06-25 DIAGNOSIS — H25812 Combined forms of age-related cataract, left eye: Secondary | ICD-10-CM | POA: Diagnosis not present

## 2016-09-03 DIAGNOSIS — Z681 Body mass index (BMI) 19 or less, adult: Secondary | ICD-10-CM | POA: Diagnosis not present

## 2016-09-03 DIAGNOSIS — I10 Essential (primary) hypertension: Secondary | ICD-10-CM | POA: Diagnosis not present

## 2016-09-03 DIAGNOSIS — L723 Sebaceous cyst: Secondary | ICD-10-CM | POA: Diagnosis not present

## 2016-09-03 DIAGNOSIS — R202 Paresthesia of skin: Secondary | ICD-10-CM | POA: Diagnosis not present

## 2016-09-05 DIAGNOSIS — L723 Sebaceous cyst: Secondary | ICD-10-CM | POA: Diagnosis not present

## 2016-09-05 DIAGNOSIS — L089 Local infection of the skin and subcutaneous tissue, unspecified: Secondary | ICD-10-CM | POA: Diagnosis not present

## 2016-09-10 DIAGNOSIS — I10 Essential (primary) hypertension: Secondary | ICD-10-CM | POA: Diagnosis not present

## 2016-09-18 DIAGNOSIS — L089 Local infection of the skin and subcutaneous tissue, unspecified: Secondary | ICD-10-CM | POA: Diagnosis not present

## 2016-09-18 DIAGNOSIS — L723 Sebaceous cyst: Secondary | ICD-10-CM | POA: Diagnosis not present

## 2017-01-13 DIAGNOSIS — I10 Essential (primary) hypertension: Secondary | ICD-10-CM | POA: Diagnosis not present

## 2017-03-19 ENCOUNTER — Encounter (HOSPITAL_COMMUNITY): Payer: Self-pay | Admitting: *Deleted

## 2017-03-19 ENCOUNTER — Ambulatory Visit (HOSPITAL_COMMUNITY)
Admission: EM | Admit: 2017-03-19 | Discharge: 2017-03-19 | Disposition: A | Discharge status: Home / Self Care | Payer: Medicare HMO | Attending: Family Medicine | Admitting: Family Medicine

## 2017-03-19 DIAGNOSIS — M25561 Pain in right knee: Secondary | ICD-10-CM

## 2017-03-19 MED ORDER — METHYLPREDNISOLONE 4 MG PO TABS: 4.0000 mg | ORAL_TABLET | Freq: Two times a day (BID) | ORAL | 0 refills | Status: AC

## 2017-03-19 NOTE — ED Triage Notes (Signed)
Pt  Reports  r  Knee  Pain   X    3   Weeks     Pt  Denies   Any  Injury      She  Reports   No  Recent  Injury  However   Bends  Her knee  A  Lot  At   Work        She  ambulated  In  With a  Slow  Yet  Steady  Gait

## 2017-03-19 NOTE — ED Provider Notes (Signed)
[facility]    CSN: [id number] Arrival date & time: 03/19/17  1634     History   Chief Complaint Chief Complaint  Patient presents with  . Knee Pain    HPI Sharon Coleman is a 83 y.o. female.   Pt  Reports  r  Knee  Pain   X    3   Weeks     Pt  Denies   Any  Injury      She  Reports   No  Recent  Injury  However   Bends  Her knee  A  Lot  At   Work        She  ambulated  In  With a  Slow  Yet  Steady  Gait   Patient states that she has been kneeling on concrete floors while working at [employer]. She plans to stop doing this activity now. The pain is on the anterior surface of the knee over the kneecap and anterior tibial tubercle of the right knee.      Past Medical History:  Diagnosis Date  . History of stomach ulcers   . Hypertension     There are no active problems to display for this patient.   Past Surgical History:  Procedure Laterality Date  . CARPAL TUNNEL RELEASE      OB History    No data available       Home Medications    Prior to Admission medications   Medication Sig Start Date End Date Taking? Authorizing Provider  amLODipine (NORVASC) 5 MG tablet Take 5 mg by mouth at bedtime.    Historical Provider, MD  HYDROcodone-acetaminophen (NORCO/VICODIN) 5-325 MG per tablet Take 1-2 tablets by mouth every 6 (six) hours as needed. 10/06/14   Teressa LowerVrinda Pickering, NP  losartan (COZAAR) 100 MG tablet Take 100 mg by mouth 2 (two) times daily.     Historical Provider, MD  methylPREDNISolone (MEDROL) 4 MG tablet Take 1 tablet (4 mg total) by mouth 2 (two) times daily. 03/19/17   Elvina SidleKurt Dayon Witt, MD    Family History History reviewed. No pertinent family history.  Social History Social History  Substance Use Topics  . Smoking status: Never Smoker  . Smokeless tobacco: Never Used  . Alcohol use No     Allergies   Aspirin and Penicillins   Review of Systems Review of Systems  Musculoskeletal: Positive for arthralgias and gait problem.  Negative for joint swelling.  All other systems reviewed and are negative.    Physical Exam Triage Vital Signs ED Triage Vitals  Enc Vitals Group     BP 03/19/17 1657 (!) 228/94     Pulse Rate 03/19/17 1657 78     Resp 03/19/17 1657 18     Temp 03/19/17 1657 98.6 F (37 C)     Temp Source 03/19/17 1657 Oral     SpO2 03/19/17 1657 98 %     Weight --      Height --      Head Circumference --      Peak Flow --      Pain Score 03/19/17 1658 6     Pain Loc --      Pain Edu? --      Excl. in GC? --    No data found.   Updated Vital Signs BP (!) 228/94 (BP Location: Right Arm)   Pulse 78   Temp 98.6 F (37 C) (Oral)   Resp 18  SpO2 98%    Physical Exam  Constitutional: She is oriented to person, place, and time. She appears well-developed and well-nourished.  HENT:  Right Ear: External ear normal.  Left Ear: External ear normal.  Mouth/Throat: Oropharynx is clear and moist.  Eyes: Conjunctivae and EOM are normal. Pupils are equal, round, and reactive to light.  Neck: Normal range of motion. Neck supple.  Pulmonary/Chest: Effort normal.  Musculoskeletal: Normal range of motion. She exhibits tenderness. She exhibits no edema or deformity.  Very faint crepitus over the inferior patellar tendon when flexing and extending the knee on the right.  Neurological: She is alert and oriented to person, place, and time.  Skin: Skin is warm and dry.  Nursing note and vitals reviewed.    UC Treatments / Results  Labs (all labs ordered are listed, but only abnormal results are displayed) Labs Reviewed - No data to display  EKG  EKG Interpretation None       Radiology No results found.  Procedures Procedures (including critical care time)  Medications Ordered in UC Medications - No data to display   Initial Impression / Assessment and Plan / UC Course  I have reviewed the triage vital signs and the nursing notes.  Pertinent labs & imaging results that were  available during my care of the patient were reviewed by me and considered in my medical decision making (see chart for details).     Final Clinical Impressions(s) / UC Diagnoses   Final diagnoses:  Acute pain of right knee    New Prescriptions New Prescriptions   METHYLPREDNISOLONE (MEDROL) 4 MG TABLET    Take 1 tablet (4 mg total) by mouth 2 (two) times daily.     Elvina Sidle, MD 03/19/17 [id number]

## 2017-03-25 DIAGNOSIS — R05 Cough: Secondary | ICD-10-CM | POA: Diagnosis not present

## 2017-03-25 DIAGNOSIS — I10 Essential (primary) hypertension: Secondary | ICD-10-CM | POA: Diagnosis not present

## 2017-04-10 DIAGNOSIS — M1711 Unilateral primary osteoarthritis, right knee: Secondary | ICD-10-CM | POA: Diagnosis not present

## 2018-05-10 ENCOUNTER — Encounter (HOSPITAL_COMMUNITY): Payer: Self-pay | Admitting: Family Medicine

## 2018-05-10 ENCOUNTER — Ambulatory Visit (HOSPITAL_COMMUNITY)
Admission: EM | Admit: 2018-05-10 | Discharge: 2018-05-10 | Disposition: A | Discharge status: Home / Self Care | Payer: Medicare HMO | Attending: Family Medicine | Admitting: Family Medicine

## 2018-05-10 ENCOUNTER — Ambulatory Visit (INDEPENDENT_AMBULATORY_CARE_PROVIDER_SITE_OTHER): Payer: Medicare HMO

## 2018-05-10 DIAGNOSIS — M542 Cervicalgia: Secondary | ICD-10-CM

## 2018-05-10 MED ORDER — PREDNISONE 20 MG PO TABS: 20.0000 mg | ORAL_TABLET | Freq: Every day | ORAL | 0 refills | Status: AC

## 2018-05-10 NOTE — Discharge Instructions (Signed)
Neck pain Underlying arthritis No fracture or serious finding on x ray Take the prednisone as directed Ice to area Return as needed

## 2018-05-10 NOTE — ED Provider Notes (Signed)
[facility]    CSN: [id number] Arrival date & time: 05/10/18  1428     History   Chief Complaint Chief Complaint  Patient presents with  . Neck Pain    HPI Sharon Coleman is a 84 y.o. female.   HPI  Patient is here for neck pain.  She states she has known degenerative disease.  She periodically has neck stiffness and pain.  She states that on Friday she went outdoors to check her electric panel, and as she was coming in she inadvertently hit her face on the side of a satellite dish.  She states that caused her to jerk her head suddenly.  Her face was sore but not bleeding.  There is since then her neck has been painful.  It stiff with decreased range of motion.  No numbness or weakness into the arms.  No headache.  She has not taken any medications except Tylenol.  She did use a pain rub.  She is used some warmth, but no cold.  Denies trouble with gait or balance.  Past Medical History:  Diagnosis Date  . History of stomach ulcers   . Hypertension     There are no active problems to display for this patient.   Past Surgical History:  Procedure Laterality Date  . CARPAL TUNNEL RELEASE      OB History   None      Home Medications    Prior to Admission medications   Medication Sig Start Date End Date Taking? Authorizing Provider  amLODipine (NORVASC) 5 MG tablet Take 5 mg by mouth at bedtime.    [provider]  HYDROcodone-acetaminophen (NORCO/VICODIN) 5-325 MG per tablet Take 1-2 tablets by mouth every 6 (six) hours as needed. 10/06/14   Teressa Lower, NP  losartan (COZAAR) 100 MG tablet Take 100 mg by mouth 2 (two) times daily.     [provider]  methylPREDNISolone (MEDROL) 4 MG tablet Take 1 tablet (4 mg total) by mouth 2 (two) times daily. 03/19/17   Elvina Sidle, MD  predniSONE (DELTASONE) 20 MG tablet Take 1 tablet (20 mg total) by mouth daily with breakfast. Take two today 05/10/18   Eustace Moore, MD    Family  History History reviewed. No pertinent family history.  Social History Social History   Tobacco Use  . Smoking status: Never Smoker  . Smokeless tobacco: Never Used  Substance Use Topics  . Alcohol use: No  . Drug use: No     Allergies   Aspirin and Penicillins   Review of Systems Review of Systems  Constitutional: Negative for fatigue and unexpected weight change.  HENT: Negative for congestion and dental problem.   Eyes: Negative for photophobia and visual disturbance.  Respiratory: Negative for cough and shortness of breath.   Cardiovascular: Negative for chest pain and palpitations.  Gastrointestinal: Negative for constipation and diarrhea.  Genitourinary: Negative for dysuria and frequency.  Musculoskeletal: Positive for neck pain and neck stiffness. Negative for arthralgias and back pain.  Skin: Negative for color change and rash.  Neurological: Negative for weakness and numbness.  Psychiatric/Behavioral: Positive for sleep disturbance.       Neck is keeping her awake  All other systems reviewed and are negative.    Physical Exam Triage Vital Signs ED Triage Vitals [05/10/18 1454]  Enc Vitals Group     BP (!) 164/97     Pulse Rate (!) 101     Resp 18  Temp 98.2 F (36.8 C)     Temp src      SpO2 100 %     Weight      Height      Head Circumference      Peak Flow      Pain Score      Pain Loc      Pain Edu?      Excl. in GC?    No data found.  Updated Vital Signs BP (!) 164/97   Pulse (!) 101   Temp 98.2 F (36.8 C)   Resp 18   SpO2 100%     Physical Exam  Constitutional: She appears well-developed and well-nourished. No distress.  Thin elderly woman.  Cooperative and speech appropriate.  Moderately uncomfortable.  HENT:  Head: Normocephalic and atraumatic.  Mouth/Throat: Oropharynx is clear and moist.  Eyes: Conjunctivae are normal.  Neck: No thyromegaly present.  Limited range of motion in all directions.  Cardiovascular: Normal  rate, regular rhythm and normal heart sounds.  No murmur heard. Pulmonary/Chest: Effort normal and breath sounds normal. No respiratory distress.  Musculoskeletal: She exhibits no edema.  Strength sensation range of motion and reflexes are normal in both upper extremities.  Mild tenderness at the C 7/8 area over the cervical spine.  No palpable muscle spasm  Lymphadenopathy:    She has no cervical adenopathy.  Neurological: She is alert.  Skin: Skin is warm and dry.  Psychiatric: She has a normal mood and affect.  Nursing note and vitals reviewed.    UC Treatments / Results  Labs (all labs ordered are listed, but only abnormal results are displayed) Labs Reviewed - No data to display  EKG None  Radiology Dg Cervical Spine Complete  Result Date: 05/10/2018 CLINICAL DATA:  Stiffness and neck pain since striking the side of the face 1 week ago. The patient reports inability to bend the neck return the neck from side-to-side. EXAM: CERVICAL SPINE - COMPLETE 4+ VIEW COMPARISON:  None in PACs FINDINGS: The cervical vertebral bodies are preserved in height. There is moderate disc space narrowing at C4-5 with milder narrowing at C5-6. There are anterior endplate osteophytes from C3 inferiorly with near bridging at C6-7 and C7-T1. There is degenerative facet joint change at C7-T1. The oblique views are limited in positioning but there is multilevel bilateral bony encroachment upon the neural foramina secondary to uncovertebral joint and facet joint hypertrophy as well as posterior endplate spurring at C4 and C5. The odontoid is intact. The prevertebral soft tissue spaces are normal. IMPRESSION: Multilevel bilateral bony encroachment upon the neural foramina which is likely impacting the cervical nerve roots. No compression fracture nor high-grade disc space narrowing. Electronically Signed   By: David  [place] M.D.   On: 05/10/2018 16:02    Procedures Procedures (including critical care  time)  Medications Ordered in UC Medications - No data to display  Initial Impression / Assessment and Plan / UC Course  I have reviewed the triage vital signs and the nursing notes.  Pertinent labs & imaging results that were available during my care of the patient were reviewed by me and considered in my medical decision making (see chart for details).     Discussed with patient that although she has underlying degenerative disc disease there is no evidence based on plain x-rays that she did any damage.  There is no evidence based on physical examination that she has a pinched nerve symptoms or disc injury.  I predict  improvement with conservative management. Final Clinical Impressions(s) / UC Diagnoses   Final diagnoses:  Neck pain     Discharge Instructions     Neck pain Underlying arthritis No fracture or serious finding on x ray Take the prednisone as directed Ice to area Return as needed    ED Prescriptions    Medication Sig Dispense Auth. Provider   predniSONE (DELTASONE) 20 MG tablet Take 1 tablet (20 mg total) by mouth daily with breakfast. Take two today 10 tablet Eustace Moore, MD     Controlled Substance Prescriptions [place] Controlled Substance Registry consulted? Not Applicable   Eustace Moore, MD 05/10/18 2107

## 2018-05-10 NOTE — ED Triage Notes (Addendum)
Pt here for left and right sided neck pain since last week. sts that Friday she bumped her face against the satellite dish outside. No falls weakness. She has been taking tylenol with some relief and a cream.

## 2018-06-16 DIAGNOSIS — I10 Essential (primary) hypertension: Secondary | ICD-10-CM | POA: Diagnosis not present

## 2018-06-16 DIAGNOSIS — R21 Rash and other nonspecific skin eruption: Secondary | ICD-10-CM | POA: Diagnosis not present

## 2018-06-16 DIAGNOSIS — G4762 Sleep related leg cramps: Secondary | ICD-10-CM | POA: Diagnosis not present

## 2018-07-28 DIAGNOSIS — R252 Cramp and spasm: Secondary | ICD-10-CM | POA: Diagnosis not present

## 2018-07-28 DIAGNOSIS — I10 Essential (primary) hypertension: Secondary | ICD-10-CM | POA: Diagnosis not present

## 2018-07-28 DIAGNOSIS — Z681 Body mass index (BMI) 19 or less, adult: Secondary | ICD-10-CM | POA: Diagnosis not present

## 2018-08-19 DIAGNOSIS — Z961 Presence of intraocular lens: Secondary | ICD-10-CM | POA: Diagnosis not present

## 2018-08-19 DIAGNOSIS — H25812 Combined forms of age-related cataract, left eye: Secondary | ICD-10-CM | POA: Diagnosis not present

## 2018-08-19 DIAGNOSIS — H10413 Chronic giant papillary conjunctivitis, bilateral: Secondary | ICD-10-CM | POA: Diagnosis not present

## 2018-12-24 DIAGNOSIS — G4762 Sleep related leg cramps: Secondary | ICD-10-CM | POA: Diagnosis not present

## 2018-12-24 DIAGNOSIS — N183 Chronic kidney disease, stage 3 (moderate): Secondary | ICD-10-CM | POA: Diagnosis not present

## 2018-12-24 DIAGNOSIS — I10 Essential (primary) hypertension: Secondary | ICD-10-CM | POA: Diagnosis not present

## 2018-12-24 DIAGNOSIS — Z681 Body mass index (BMI) 19 or less, adult: Secondary | ICD-10-CM | POA: Diagnosis not present

## 2019-01-06 ENCOUNTER — Encounter (HOSPITAL_COMMUNITY): Payer: Self-pay

## 2019-01-06 ENCOUNTER — Emergency Department (HOSPITAL_COMMUNITY)
Admission: EM | Admit: 2019-01-06 | Discharge: 2019-01-29 | Disposition: E | Payer: Medicare HMO | Attending: Emergency Medicine | Admitting: Emergency Medicine

## 2019-01-06 ENCOUNTER — Emergency Department (HOSPITAL_COMMUNITY): Payer: Medicare HMO

## 2019-01-06 ENCOUNTER — Other Ambulatory Visit: Payer: Self-pay

## 2019-01-06 DIAGNOSIS — R404 Transient alteration of awareness: Secondary | ICD-10-CM | POA: Diagnosis not present

## 2019-01-06 DIAGNOSIS — I252 Old myocardial infarction: Secondary | ICD-10-CM | POA: Insufficient documentation

## 2019-01-06 DIAGNOSIS — I614 Nontraumatic intracerebral hemorrhage in cerebellum: Secondary | ICD-10-CM

## 2019-01-06 DIAGNOSIS — I10 Essential (primary) hypertension: Secondary | ICD-10-CM | POA: Diagnosis not present

## 2019-01-06 DIAGNOSIS — I213 ST elevation (STEMI) myocardial infarction of unspecified site: Secondary | ICD-10-CM

## 2019-01-06 DIAGNOSIS — E872 Acidosis, unspecified: Secondary | ICD-10-CM

## 2019-01-06 DIAGNOSIS — S0990XA Unspecified injury of head, initial encounter: Secondary | ICD-10-CM | POA: Diagnosis not present

## 2019-01-06 DIAGNOSIS — G919 Hydrocephalus, unspecified: Secondary | ICD-10-CM | POA: Diagnosis not present

## 2019-01-06 DIAGNOSIS — I491 Atrial premature depolarization: Secondary | ICD-10-CM | POA: Diagnosis not present

## 2019-01-06 DIAGNOSIS — G911 Obstructive hydrocephalus: Secondary | ICD-10-CM | POA: Diagnosis not present

## 2019-01-06 DIAGNOSIS — I619 Nontraumatic intracerebral hemorrhage, unspecified: Secondary | ICD-10-CM | POA: Diagnosis not present

## 2019-01-06 DIAGNOSIS — I618 Other nontraumatic intracerebral hemorrhage: Secondary | ICD-10-CM | POA: Diagnosis not present

## 2019-01-06 DIAGNOSIS — S299XXA Unspecified injury of thorax, initial encounter: Secondary | ICD-10-CM | POA: Diagnosis not present

## 2019-01-06 DIAGNOSIS — S199XXA Unspecified injury of neck, initial encounter: Secondary | ICD-10-CM | POA: Diagnosis not present

## 2019-01-06 DIAGNOSIS — I2111 ST elevation (STEMI) myocardial infarction involving right coronary artery: Secondary | ICD-10-CM

## 2019-01-06 DIAGNOSIS — R52 Pain, unspecified: Secondary | ICD-10-CM | POA: Diagnosis not present

## 2019-01-06 DIAGNOSIS — I615 Nontraumatic intracerebral hemorrhage, intraventricular: Secondary | ICD-10-CM | POA: Diagnosis not present

## 2019-01-06 DIAGNOSIS — S06360A Traumatic hemorrhage of cerebrum, unspecified, without loss of consciousness, initial encounter: Secondary | ICD-10-CM | POA: Diagnosis not present

## 2019-01-06 DIAGNOSIS — R4182 Altered mental status, unspecified: Secondary | ICD-10-CM | POA: Diagnosis not present

## 2019-01-06 DIAGNOSIS — I639 Cerebral infarction, unspecified: Secondary | ICD-10-CM | POA: Diagnosis not present

## 2019-01-06 LAB — COMPREHENSIVE METABOLIC PANEL
ALT: 16 U/L (ref 0–44)
AST: 32 U/L (ref 15–41)
Albumin: 4.3 g/dL (ref 3.5–5.0)
Alkaline Phosphatase: 72 U/L (ref 38–126)
Anion gap: 11 (ref 5–15)
BILIRUBIN TOTAL: 0.5 mg/dL (ref 0.3–1.2)
BUN: 17 mg/dL (ref 8–23)
CO2: 24 mmol/L (ref 22–32)
Calcium: 9.6 mg/dL (ref 8.9–10.3)
Chloride: 101 mmol/L (ref 98–111)
Creatinine, Ser: 1.64 mg/dL — ABNORMAL HIGH (ref 0.44–1.00)
GFR calc Af Amer: 33 mL/min — ABNORMAL LOW (ref >60)
GFR calc non Af Amer: 28 mL/min — ABNORMAL LOW (ref >60)
Glucose, Bld: 167 mg/dL — ABNORMAL HIGH (ref 70–99)
Potassium: 3.2 mmol/L — ABNORMAL LOW (ref 3.5–5.1)
Sodium: 136 mmol/L (ref 135–145)
Total Protein: 7.3 g/dL (ref 6.5–8.1)

## 2019-01-06 LAB — BPAM FFP
Blood Product Expiration Date: 202001262359
Blood Product Expiration Date: 202001272359
ISSUE DATE / TIME: 202001091531
ISSUE DATE / TIME: 202001091531
UNIT TYPE AND RH: 6200
Unit Type and Rh: 6200

## 2019-01-06 LAB — I-STAT ARTERIAL BLOOD GAS, ED
Acid-Base Excess: 5 mmol/L — ABNORMAL HIGH (ref 0.0–2.0)
Bicarbonate: 31.5 mmol/L — ABNORMAL HIGH (ref 20.0–28.0)
O2 SAT: 100 %
PH ART: 7.372 (ref 7.350–7.450)
TCO2: 33 mmol/L — ABNORMAL HIGH (ref 22–32)
pCO2 arterial: 54.3 mmHg — ABNORMAL HIGH (ref 32.0–48.0)
pO2, Arterial: 628 mmHg — ABNORMAL HIGH (ref 83.0–108.0)

## 2019-01-06 LAB — I-STAT TROPONIN, ED: Troponin i, poc: 0.01 ng/mL (ref 0.00–0.08)

## 2019-01-06 LAB — I-STAT CHEM 8, ED
BUN: 18 mg/dL (ref 8–23)
Calcium, Ion: 1.16 mmol/L (ref 1.15–1.40)
Chloride: 100 mmol/L (ref 98–111)
Creatinine, Ser: 1.7 mg/dL — ABNORMAL HIGH (ref 0.44–1.00)
Glucose, Bld: 169 mg/dL — ABNORMAL HIGH (ref 70–99)
HCT: 34 % — ABNORMAL LOW (ref 36.0–46.0)
Hemoglobin: 11.6 g/dL — ABNORMAL LOW (ref 12.0–15.0)
Potassium: 3.2 mmol/L — ABNORMAL LOW (ref 3.5–5.1)
Sodium: 138 mmol/L (ref 135–145)
TCO2: 29 mmol/L (ref 22–32)

## 2019-01-06 LAB — CBC
HCT: 34.8 % — ABNORMAL LOW (ref 36.0–46.0)
Hemoglobin: 11 g/dL — ABNORMAL LOW (ref 12.0–15.0)
MCH: 29 pg (ref 26.0–34.0)
MCHC: 31.6 g/dL (ref 30.0–36.0)
MCV: 91.8 fL (ref 80.0–100.0)
Platelets: 211 10*3/uL (ref 150–400)
RBC: 3.79 MIL/uL — ABNORMAL LOW (ref 3.87–5.11)
RDW: 12 % (ref 11.5–15.5)
WBC: 6.7 10*3/uL (ref 4.0–10.5)
nRBC: 0 % (ref 0.0–0.2)

## 2019-01-06 LAB — PREPARE FRESH FROZEN PLASMA
Unit division: 0
Unit division: 0

## 2019-01-06 LAB — ETHANOL

## 2019-01-06 LAB — PROTIME-INR
INR: 0.93
Prothrombin Time: 12.4 seconds (ref 11.4–15.2)

## 2019-01-06 LAB — I-STAT CG4 LACTIC ACID, ED: Lactic Acid, Venous: 2.64 mmol/L (ref 0.5–1.9)

## 2019-01-06 LAB — ABO/RH: ABO/RH(D): O POS

## 2019-01-06 MED ORDER — SODIUM CHLORIDE 0.9 % IV SOLN
INTRAVENOUS | Status: AC | PRN
  Administered 2019-01-06: 1000 mL via INTRAVENOUS

## 2019-01-06 MED ORDER — ATROPINE SULFATE 1 MG/10ML IJ SOSY
PREFILLED_SYRINGE | INTRAMUSCULAR | Status: AC | PRN
  Administered 2019-01-06: 0.5 mg via INTRAVENOUS

## 2019-01-06 MED ORDER — FENTANYL CITRATE (PF) 100 MCG/2ML IJ SOLN
INTRAMUSCULAR | Status: AC
  Administered 2019-01-06: 50 ug
  Filled 2019-01-06: qty 2

## 2019-01-06 MED ORDER — ETOMIDATE 2 MG/ML IV SOLN
INTRAVENOUS | Status: AC | PRN
  Administered 2019-01-06: 20 mg via INTRAVENOUS

## 2019-01-06 MED ORDER — PROPOFOL 1000 MG/100ML IV EMUL
INTRAVENOUS | Status: AC | PRN
  Administered 2019-01-06: 10 ug/kg/min via INTRAVENOUS

## 2019-01-06 MED ORDER — NALOXONE HCL 2 MG/2ML IJ SOSY
PREFILLED_SYRINGE | INTRAMUSCULAR | Status: AC
  Administered 2019-01-06: 1 mg
  Filled 2019-01-06: qty 2

## 2019-01-06 MED ORDER — CLEVIDIPINE BUTYRATE 0.5 MG/ML IV EMUL
0.0000 mg/h | INTRAVENOUS | Status: DC
  Filled 2019-01-06: qty 50

## 2019-01-06 MED ORDER — SUCCINYLCHOLINE CHLORIDE 20 MG/ML IJ SOLN
INTRAMUSCULAR | Status: AC | PRN
  Administered 2019-01-06: 100 mg via INTRAVENOUS

## 2019-01-06 MED ORDER — ATROPINE SULFATE 1 MG/10ML IJ SOSY
PREFILLED_SYRINGE | INTRAMUSCULAR | Status: AC
  Administered 2019-01-06: 0.5 mg
  Filled 2019-01-06: qty 10

## 2019-01-06 MED ORDER — SODIUM CHLORIDE 0.9 % IV SOLN: INTRAVENOUS | Status: DC | PRN

## 2019-01-06 MED ORDER — POTASSIUM CHLORIDE IN NACL 20-0.9 MEQ/L-% IV SOLN
INTRAVENOUS | Status: AC
  Filled 2019-01-06: qty 1000

## 2019-01-06 MED ORDER — PROPOFOL 1000 MG/100ML IV EMUL
INTRAVENOUS | Status: AC
  Filled 2019-01-06: qty 100

## 2019-01-07 LAB — BPAM RBC
BLOOD PRODUCT EXPIRATION DATE: 202002062359
Blood Product Expiration Date: 202002072359
ISSUE DATE / TIME: 202001092043
ISSUE DATE / TIME: 202001092235
Unit Type and Rh: 5100
Unit Type and Rh: 5100

## 2019-01-07 LAB — TYPE AND SCREEN
ABO/RH(D): O POS
Antibody Screen: NEGATIVE
UNIT DIVISION: 0
Unit division: 0

## 2019-01-10 ENCOUNTER — Encounter (HOSPITAL_COMMUNITY): Payer: Self-pay | Admitting: Family Medicine

## 2019-01-10 LAB — CDS SEROLOGY

## 2019-01-29 NOTE — ED Notes (Signed)
Pt noted to be bradying to the 20s, MD tegeler alerted. To bedside. Awaiting orders

## 2019-01-29 NOTE — Progress Notes (Signed)
Son and Lucila Maine arrived and was escorted to consultation room B.  EDP gave family Status update.  Patient critical and possiblly will not survive.  Son name  Tatsuko Christians  [phone] and Henderson Newcomer number  [phone].  Numbers were given to patient nurse. Will follow as needed.  Venida Jarvis, [place], [facility], Pager [phone]

## 2019-01-29 NOTE — ED Notes (Signed)
Chaplain at bedside

## 2019-01-29 NOTE — H&P (Signed)
Subjective: The patient is an 85 year old black female with a history of hypertension who, the best I can tell, passed out and fell down today.  [facility] was [facility] ER and intubated.  A head scan was obtained which demonstrated posterior fossa/left cerebellar hemorrhage with periventricular hemorrhage hydrocephalus.  A neurosurgical consultation was requested.  I immediately came over from the office in anticipation of placing a ventriculostomy.  Patient was given succinylcholine about 2 and half hours ago and her dipper Sharon Coleman was stopped approximately 45 minutes ago.  He has had some ST changes and cardiology has seen her.  Past Medical History:  Diagnosis Date  . History of stomach ulcers   . Hypertension     Past Surgical History:  Procedure Laterality Date  . CARPAL TUNNEL RELEASE      Allergies  Allergen Reactions  . Asa [Aspirin]   . Penicillins     Social History   Tobacco Use  . Smoking status: Never Smoker  . Smokeless tobacco: Never Used  Substance Use Topics  . Alcohol use: Not Currently    Frequency: Never    History reviewed. No pertinent family history. Prior to Admission medications   Not on File     Review of Systems  Positive ROS: Unobtainable  All other systems have been reviewed and were otherwise negative with the exception of those mentioned in the HPI and as above.  Objective: Vital signs in last 24 hours: Temp:  [96.5 F (35.8 C)] 96.5 F (35.8 C) (01/09 1535) Pulse Rate:  [44-110] 54 (01/09 1647) Resp:  [13-23] 16 (01/09 1647) BP: (71-250)/(47-115) 78/47 (01/09 1645) SpO2:  [98 %-100 %] 100 % (01/09 1647) FiO2 (%):  [50 %-100 %] 50 % (01/09 1623) Weight:  [55 kg] 55 kg (01/09 1535) Estimated body mass index is 24.49 kg/m as calculated from the following:   Height as of this encounter: 4\' 11"  (1.499 m).   Weight as of this encounter: 55 kg.   General Appearance: Alert General: A comatose intubated 85 year-old thin black female in  no apparent distress  HEENT: Normocephalic, atraumatic, pupils approximately 5 mm and nonreactive bilaterally.  She has had an iridectomy on the right.  Neck: Supple, she is wearing a hard cervical collar  Thorax: Symmetric  Abdomen: Soft  Extremities: Unremarkable  Neurologic exam: The patient is Glasgow Coma Scale 3 intubated.  There is no response to noxious stimuli bilaterally.  She has no corneal, gag or cough reflexes.  She does not breathe over the ventilator.  I reviewed the patient's head CT performed [facility].  She has a left cerebellar intraparenchymal hemorrhage.  She has intraventricular hemorrhage with hydrocephalus.   Data Review Lab Results  Component Value Date   WBC 6.7 2019-12-20   HGB 11.6 (L) 2019-12-20   HCT 34.0 (L) 2019-12-20   MCV 91.8 2019-12-20   PLT 211 2019-12-20   Lab Results  Component Value Date   NA 138 2019-12-20   K 3.2 (L) 2019-12-20   CL 100 2019-12-20   CO2 24 2019-12-20   BUN 18 2019-12-20   CREATININE 1.70 (H) 2019-12-20   GLUCOSE 169 (H) 2019-12-20   Lab Results  Component Value Date   INR 0.93 2019-12-20    Assessment/Plan: Posterior fossa hemorrhage, intraventricular hemorrhage, hydrocephalus: I suspect she has had a ruptured aneurysm, possibly pica.  I discussed the situation with the patient's son, granddaughter and grandson.  Unfortunately she shows no signs of life, i.e.  appears clinically brain dead.  I therefore do not see any point and placing a ventriculostomy.  I suggested they gather any family members who want to say their final goodbyes.  I have answered all their questions.  ST changes, STEMI: Noted, she obviously is not a candidate for anticoagulation.   Cristi Loron February 04, 2019 5:15 PM

## 2019-01-29 NOTE — ED Triage Notes (Signed)
En route to CT w/ this RN, Trauma MD Andrey Campanile, MD [place]

## 2019-01-29 NOTE — Consult Note (Signed)
Stroke Neurology Consultation Note  Consult Requested by: Dr. Rush Landmark   Reason for Consult: ICH with IVH  Consult Date: 01/21/2019  The history was obtained from the ED staff.  During history and examination, all items were not able to obtain unless otherwise noted.  History of Present Illness:  Sharon Coleman is a 85 y.o. African American female with PMH of cervical spinal stenosis was witness by neighbor outside beside trash can and had sudden fall, hitting her head. EMS called. On arrival, pt was awake and was able to talk but had rapid deterioration to repeat herself and became unresponsive and difficulty breathing. She was bagged on the way to the ER and was intubated in ER. Initially BP was high at 200s/100s, pupils pinpoint. Ct showed left cerebellar hemorrhage with extensive IVH and mild hydrocephalus. cleviprex started and on propofol. Pt was found to have posturing but then in deep coma, not responding at all. Eyes open and pupil 45mm fixed. No brainstem reflex. Propofol stopped. NSG consulted and plan for EVD placement. However, pt BP plunged to 80s and cleviprex stopped. EKG showed new STEMI, cardiology consulted.   LSN: around 3pm tPA Given: No: ICH with IVH  No past medical history on file.   The histories are not reviewed yet. Please review them in the "History" navigator section and refresh this SmartLink.  No family history on file.  Social History:  has no history on file for tobacco, alcohol, and drug.  Allergies: Allergies not on file  No current facility-administered medications on file prior to encounter.    No current outpatient medications on file prior to encounter.    Review of Systems: A full ROS was attempted today and was not able to be performed.    Physical Examination: Temp:  [96.5 F (35.8 C)] 96.5 F (35.8 C) (01/09 1535) Resp:  [16] 16 (01/09 1535) BP: (88)/(62) 88/62 (01/09 1535) SpO2:  [98 %-100 %] 100 % (01/09 1623) FiO2 (%):  [50 %-100  %] 50 % (01/09 1623) Weight:  [55 kg] 55 kg (01/09 1535)  General - thin built, well developed, intubated.    Ophthalmologic - fundi not visualized due to noncooperation.    Cardiovascular - regular rate and rhythm  Neuro - intubated, in deep coma, off propofol for 15 min, eyes open spontaneously, pupils 41mm, not reactive. No corneal bilaterally. No gag or cough. Not breathing over the vent. On pain stimulation, no movement of all extremities, but observed slight toe intermittent spontaneous movement at left without stimulation, not rhythmic. DTR diminished, no babinski.   Data Reviewed: Ct Head Wo Contrast  Result Date: 01-21-19 CLINICAL DATA:  Altered mental status, unwitnessed fall, trauma EXAM: CT HEAD WITHOUT CONTRAST CT CERVICAL SPINE WITHOUT CONTRAST TECHNIQUE: Multidetector CT imaging of the head and cervical spine was performed following the standard protocol without intravenous contrast. Multiplanar CT image reconstructions of the cervical spine were also generated. COMPARISON:  None. FINDINGS: CT HEAD FINDINGS Brain: There is a large volume of acute diffuse interventricular hemorrhage with mixed attenuation and associated mild obstructive hydrocephalus. There is an acute mixed attenuation left cerebellar intraparenchymal hemorrhage measuring 3.3 x 2.4 x 1.7 cm (6.7 cc). Mild brain atrophy noted. Chronic white matter microvascular ischemic changes throughout both cerebral hemispheres. Mass effect results in effacement of the basilar cisterns. No significant midline shift or herniation. No extra-axial fluid or subdural hemorrhage. Vascular: Intracranial atherosclerosis.  No hyperdense vessel. Skull: Normal. Negative for fracture or focal lesion. Sinuses/Orbits: No acute finding. Other:  None. CT CERVICAL SPINE FINDINGS Alignment: No subluxation or dislocation.  Preserved alignment. Skull base and vertebrae: Multilevel degenerative changes. No acute osseous finding or fracture. Soft tissues and  spinal canal: Acute left cerebellar and interventricular hemorrhage noted the fourth ventricle extending to the brainstem. No prevertebral soft tissue swelling. Disc levels: Multilevel degenerative disc disease, most severe at C4-5 and C5-6. Multilevel facet arthropathy posteriorly without subluxation dislocation. Degenerative changes of the C1-2 articulation. Upper chest: Mild apical emphysema changes. Endotracheal tube and NG tube noted. Atherosclerotic changes evident of the major branch vessels and carotid arteries. Other: None. IMPRESSION: Acute left cerebellar intraparenchymal hemorrhage measuring up to 3.3 cm with extension into the entire ventricular system. There is associated diffuse large volume interventricular hemorrhage with mild obstructive hydrocephalus. Background brain atrophy and chronic white matter microvascular changes Cervical degenerative changes without acute osseous finding or malalignment. These results were called by telephone at the time of interpretation on 12/30/2018 at 4:38 pm to Dr. Gaynelle AduEric Wilson, who verbally acknowledged these results. Electronically Signed   By: Judie PetitM.  Shick M.D.   On: 2019/06/30 16:39   Ct Cervical Spine Wo Contrast  Result Date: 01/05/2019 CLINICAL DATA:  Altered mental status, unwitnessed fall, trauma EXAM: CT HEAD WITHOUT CONTRAST CT CERVICAL SPINE WITHOUT CONTRAST TECHNIQUE: Multidetector CT imaging of the head and cervical spine was performed following the standard protocol without intravenous contrast. Multiplanar CT image reconstructions of the cervical spine were also generated. COMPARISON:  None. FINDINGS: CT HEAD FINDINGS Brain: There is a large volume of acute diffuse interventricular hemorrhage with mixed attenuation and associated mild obstructive hydrocephalus. There is an acute mixed attenuation left cerebellar intraparenchymal hemorrhage measuring 3.3 x 2.4 x 1.7 cm (6.7 cc). Mild brain atrophy noted. Chronic white matter microvascular ischemic  changes throughout both cerebral hemispheres. Mass effect results in effacement of the basilar cisterns. No significant midline shift or herniation. No extra-axial fluid or subdural hemorrhage. Vascular: Intracranial atherosclerosis.  No hyperdense vessel. Skull: Normal. Negative for fracture or focal lesion. Sinuses/Orbits: No acute finding. Other: None. CT CERVICAL SPINE FINDINGS Alignment: No subluxation or dislocation.  Preserved alignment. Skull base and vertebrae: Multilevel degenerative changes. No acute osseous finding or fracture. Soft tissues and spinal canal: Acute left cerebellar and interventricular hemorrhage noted the fourth ventricle extending to the brainstem. No prevertebral soft tissue swelling. Disc levels: Multilevel degenerative disc disease, most severe at C4-5 and C5-6. Multilevel facet arthropathy posteriorly without subluxation dislocation. Degenerative changes of the C1-2 articulation. Upper chest: Mild apical emphysema changes. Endotracheal tube and NG tube noted. Atherosclerotic changes evident of the major branch vessels and carotid arteries. Other: None. IMPRESSION: Acute left cerebellar intraparenchymal hemorrhage measuring up to 3.3 cm with extension into the entire ventricular system. There is associated diffuse large volume interventricular hemorrhage with mild obstructive hydrocephalus. Background brain atrophy and chronic white matter microvascular changes Cervical degenerative changes without acute osseous finding or malalignment. These results were called by telephone at the time of interpretation on 01/22/2019 at 4:38 pm to Dr. Gaynelle AduEric Wilson, who verbally acknowledged these results. Electronically Signed   By: Judie PetitM.  Shick M.D.   On: 2019/06/30 16:39   Dg Chest Port 1 View  Result Date: 12/29/2018 CLINICAL DATA:  Fall. EXAM: PORTABLE CHEST 1 VIEW COMPARISON:  No prior. FINDINGS: Endotracheal tube noted with its tip 2.5 cm above the carina. NG tube noted tip below left  hemidiaphragm. Mediastinum and hilar structures normal. Cardiomegaly. No pulmonary venous congestion. Aortic and upper extremity peripheral vascular disease. No focal alveolar  infiltrate. No pleural effusion or pneumothorax. Degenerative change thoracic spine. IMPRESSION: 1. Endotracheal tube tip noted 2.5 cm above the carina. NG tube tip noted below left hemidiaphragm. 2. Cardiomegaly. No pulmonary venous congestion. Thoracic aortic and upper extremity peripheral vascular disease. 3.  No acute pulmonary disease. 4. Degenerative change thoracic spine. No acute bony abnormalities identified. Electronically Signed   By: Maisie Fushomas  Register   On: 02-09-19 16:30    Assessment: 85 y.o. female with PMH of cervical spinal stenosis admitted after witness fall at outside home hitting head. CT showed left cerebellar hemorrhage with extensive IVH and mild obstructive hydrocephalus. BP initially high but then plunged and EKG showed STEMI. Currently, trauma, neurosurgery and cardiology services are involved. Initial plan for EVD however, current STEMI complicated the decision making.   Neurologically, pt shortly after propofol, but no any brainstem reflexes, no movement no pain but still has subtle toe spontaneous movement. Given current hydrocephalus, extensive IVH, cerebellar ICH, hypotension, STEMI, poor neuro exam, the prognosis would be extremely poor. Will need more family discussion and palliative care discussion also.   Plan: - treat for STEMI as per cardiology  - consider EVD by NSG if family would like aggressive care - BP goal < 140 - will need family discussion and palliative care involvement.  - will follow.   Thank you for this consultation and allowing us to participate in the care of this patient.  This patient is critically ill due to ICH and extensive IVH and hydrocephalus and STEMI and coma and at significant risk of neurological worsening, death form brain death, hydrocephalus, heart failure,  cardiogenic shock. This patient's care requires constant monitoring of vital signs, hemodynamics, respiratory and cardiac monitoring, review of multiple databases, neurological assessment, discussion with family, other specialists and medical decision making of high complexity. I spent 40 minutes of neurocritical care time in the care of this patient.  Marvel PlanJindong Yvett Rossel, MD PhD Stroke Neurology 01/07/2019 5:05 PM

## 2019-01-29 NOTE — Consult Note (Signed)
Cardiology Consultation:   Patient ID: Sharon Coleman MRN: [id number]; DOB: 03-May-1931  Admit date: 01/11/2019 Date of Consult: 01/27/2019  Primary Care Provider: No primary care provider on file. Primary Cardiologist: No primary care provider on file.  Primary Electrophysiologist:  None    Patient Profile:   Sharon Coleman is a 85 y.o. female who is being seen today for the evaluation of Acute STEMI at the request of Dr Rush Landmarkegeler.  History of Present Illness:   Sharon Coleman is an 85 yo BF with history of HTN. She was witnessed by neighbors to fall today while taking out trash and hit her head on concrete. EMS called. Initially patient responsive but rapidly deteriorated and was Glascow trauma score 3 on arrival and unresponsive. She was severely hypertensive initially. Initial Ecg showed sinus rhythm with no acute changes. She was intubated. Emergent head CT showed a massive cerebellar bleed extending into the ventricles. She subsequently became hypotensive and repeat Ecg showed marked ST elevation in the inferior leads of 4 mm. Patient is intubated and unresponsive. Unable to obtain any personal history.   Past Medical History:  Diagnosis Date  . History of stomach ulcers   . Hypertension     Past Surgical History:  Procedure Laterality Date  . CARPAL TUNNEL RELEASE         Inpatient Medications: Scheduled Meds: . atropine       Continuous Infusions: . sodium chloride    . 0.9 % NaCl with KCl 20 mEq / L Stopped (2019/10/13 1728)  . clevidipine Stopped (2019/10/13 1704)   PRN Meds: Place/Maintain arterial line **AND** sodium chloride  Allergies:    Allergies  Allergen Reactions  . Asa [Aspirin]   . Penicillins     Social History:   Social History   Socioeconomic History  . Marital status: Single    Spouse name: Not on file  . Number of children: Not on file  . Years of education: Not on file  . Highest education level: Not on file  Occupational History    . Not on file  Social Needs  . Financial resource strain: Not on file  . Food insecurity:    Worry: Not on file    Inability: Not on file  . Transportation needs:    Medical: Not on file    Non-medical: Not on file  Tobacco Use  . Smoking status: Never Smoker  . Smokeless tobacco: Never Used  Substance and Sexual Activity  . Alcohol use: Not Currently    Frequency: Never  . Drug use: Never  . Sexual activity: Not on file  Lifestyle  . Physical activity:    Days per week: Not on file    Minutes per session: Not on file  . Stress: Not on file  Relationships  . Social connections:    Talks on phone: Not on file    Gets together: Not on file    Attends religious service: Not on file    Active member of club or organization: Not on file    Attends meetings of clubs or organizations: Not on file    Relationship status: Not on file  . Intimate partner violence:    Fear of current or ex partner: Not on file    Emotionally abused: Not on file    Physically abused: Not on file    Forced sexual activity: Not on file  Other Topics Concern  . Not on file  Social History Narrative  . Not on  file    Family History:   History reviewed. No pertinent family history.   ROS:  Please see the history of present illness. unresponsive.    Physical Exam/Data:   Vitals:   03/08/2019 1644 03/08/2019 1645 03/08/2019 1646 03/08/2019 1647  BP: (!) 79/47 (!) 78/47    Pulse: (!) 55 (!) 54 (!) 54 (!) 54  Resp: 16 16 16 16   Temp:      TempSrc:      SpO2: 100% 100% 100% 100%  Weight:      Height:        Intake/Output Summary (Last 24 hours) at 12/30/2018 1728 Last data filed at 01/05/2019 1719 Gross per 24 hour  Intake 4000 ml  Output 0 ml  Net 4000 ml   Last 3 Weights 01/02/2019  Weight (lbs) 121 lb 4.1 oz  Weight (kg) 55 kg     Body mass index is 24.49 kg/m.  General:  Thin BF unresponsive on vent HEENT: pupils fixed Lymph: no adenopathy Neck: no JVD Endocrine:  No  thryomegaly Vascular: No carotid bruits; FA pulses 2+ bilaterally without bruits  Cardiac:  normal S1, S2; RRR; no murmur  Lungs:  clear to auscultation bilaterally, no wheezing, rhonchi or rales  Abd: soft, nontender, no hepatomegaly  Ext: no edema Skin: warm and dry  Neuro:  No neurologic response Psych:  Normal affect   EKG:  The EKG was personally reviewed and demonstrates:  Sinus brady rate 52 with marked ST elevation inferiorly c/w STEMI Telemetry:  Telemetry was personally reviewed and demonstrates:  NSR  Relevant CV Studies: none  Laboratory Data:  Chemistry Recent Labs  Lab 03/08/2019 1541 03/08/2019 1545  NA 136 138  K 3.2* 3.2*  CL 101 100  CO2 24  --   GLUCOSE 167* 169*  BUN 17 18  CREATININE 1.64* 1.70*  CALCIUM 9.6  --   GFRNONAA 28*  --   GFRAA 33*  --   ANIONGAP 11  --     Recent Labs  Lab 03/08/2019 1541  PROT 7.3  ALBUMIN 4.3  AST 32  ALT 16  ALKPHOS 72  BILITOT 0.5   Hematology Recent Labs  Lab 03/08/2019 1541 03/08/2019 1545  WBC 6.7  --   RBC 3.79*  --   HGB 11.0* 11.6*  HCT 34.8* 34.0*  MCV 91.8  --   MCH 29.0  --   MCHC 31.6  --   RDW 12.0  --   PLT 211  --    Cardiac EnzymesNo results for input(s): TROPONINI in the last 168 hours.  Recent Labs  Lab 03/08/2019 1544  TROPIPOC 0.01    BNPNo results for input(s): BNP, PROBNP in the last 168 hours.  DDimer No results for input(s): DDIMER in the last 168 hours.  Radiology/Studies:  Ct Head Wo Contrast  Result Date: 01/07/2019 CLINICAL DATA:  Altered mental status, unwitnessed fall, trauma EXAM: CT HEAD WITHOUT CONTRAST CT CERVICAL SPINE WITHOUT CONTRAST TECHNIQUE: Multidetector CT imaging of the head and cervical spine was performed following the standard protocol without intravenous contrast. Multiplanar CT image reconstructions of the cervical spine were also generated. COMPARISON:  None. FINDINGS: CT HEAD FINDINGS Brain: There is a large volume of acute diffuse interventricular  hemorrhage with mixed attenuation and associated mild obstructive hydrocephalus. There is an acute mixed attenuation left cerebellar intraparenchymal hemorrhage measuring 3.3 x 2.4 x 1.7 cm (6.7 cc). Mild brain atrophy noted. Chronic white matter microvascular ischemic changes throughout both cerebral hemispheres. Mass effect  results in effacement of the basilar cisterns. No significant midline shift or herniation. No extra-axial fluid or subdural hemorrhage. Vascular: Intracranial atherosclerosis.  No hyperdense vessel. Skull: Normal. Negative for fracture or focal lesion. Sinuses/Orbits: No acute finding. Other: None. CT CERVICAL SPINE FINDINGS Alignment: No subluxation or dislocation.  Preserved alignment. Skull base and vertebrae: Multilevel degenerative changes. No acute osseous finding or fracture. Soft tissues and spinal canal: Acute left cerebellar and interventricular hemorrhage noted the fourth ventricle extending to the brainstem. No prevertebral soft tissue swelling. Disc levels: Multilevel degenerative disc disease, most severe at C4-5 and C5-6. Multilevel facet arthropathy posteriorly without subluxation dislocation. Degenerative changes of the C1-2 articulation. Upper chest: Mild apical emphysema changes. Endotracheal tube and NG tube noted. Atherosclerotic changes evident of the major branch vessels and carotid arteries. Other: None. IMPRESSION: Acute left cerebellar intraparenchymal hemorrhage measuring up to 3.3 cm with extension into the entire ventricular system. There is associated diffuse large volume interventricular hemorrhage with mild obstructive hydrocephalus. Background brain atrophy and chronic white matter microvascular changes Cervical degenerative changes without acute osseous finding or malalignment. These results were called by telephone at the time of interpretation on January 08, 2019 at 4:38 pm to Dr. Gaynelle Adu, who verbally acknowledged these results. Electronically Signed   By: Judie Petit.   Shick M.D.   On: 01-08-19 16:39   Ct Cervical Spine Wo Contrast  Result Date: 01-08-19 CLINICAL DATA:  Altered mental status, unwitnessed fall, trauma EXAM: CT HEAD WITHOUT CONTRAST CT CERVICAL SPINE WITHOUT CONTRAST TECHNIQUE: Multidetector CT imaging of the head and cervical spine was performed following the standard protocol without intravenous contrast. Multiplanar CT image reconstructions of the cervical spine were also generated. COMPARISON:  None. FINDINGS: CT HEAD FINDINGS Brain: There is a large volume of acute diffuse interventricular hemorrhage with mixed attenuation and associated mild obstructive hydrocephalus. There is an acute mixed attenuation left cerebellar intraparenchymal hemorrhage measuring 3.3 x 2.4 x 1.7 cm (6.7 cc). Mild brain atrophy noted. Chronic white matter microvascular ischemic changes throughout both cerebral hemispheres. Mass effect results in effacement of the basilar cisterns. No significant midline shift or herniation. No extra-axial fluid or subdural hemorrhage. Vascular: Intracranial atherosclerosis.  No hyperdense vessel. Skull: Normal. Negative for fracture or focal lesion. Sinuses/Orbits: No acute finding. Other: None. CT CERVICAL SPINE FINDINGS Alignment: No subluxation or dislocation.  Preserved alignment. Skull base and vertebrae: Multilevel degenerative changes. No acute osseous finding or fracture. Soft tissues and spinal canal: Acute left cerebellar and interventricular hemorrhage noted the fourth ventricle extending to the brainstem. No prevertebral soft tissue swelling. Disc levels: Multilevel degenerative disc disease, most severe at C4-5 and C5-6. Multilevel facet arthropathy posteriorly without subluxation dislocation. Degenerative changes of the C1-2 articulation. Upper chest: Mild apical emphysema changes. Endotracheal tube and NG tube noted. Atherosclerotic changes evident of the major branch vessels and carotid arteries. Other: None. IMPRESSION: Acute  left cerebellar intraparenchymal hemorrhage measuring up to 3.3 cm with extension into the entire ventricular system. There is associated diffuse large volume interventricular hemorrhage with mild obstructive hydrocephalus. Background brain atrophy and chronic white matter microvascular changes Cervical degenerative changes without acute osseous finding or malalignment. These results were called by telephone at the time of interpretation on Jan 08, 2019 at 4:38 pm to Dr. Gaynelle Adu, who verbally acknowledged these results. Electronically Signed   By: Judie Petit.  Shick M.D.   On: 2019/01/08 16:39   Dg Chest Port 1 View  Result Date: January 08, 2019 CLINICAL DATA:  Fall. EXAM: PORTABLE CHEST 1 VIEW COMPARISON:  No prior. FINDINGS:  Endotracheal tube noted with its tip 2.5 cm above the carina. NG tube noted tip below left hemidiaphragm. Mediastinum and hilar structures normal. Cardiomegaly. No pulmonary venous congestion. Aortic and upper extremity peripheral vascular disease. No focal alveolar infiltrate. No pleural effusion or pneumothorax. Degenerative change thoracic spine. IMPRESSION: 1. Endotracheal tube tip noted 2.5 cm above the carina. NG tube tip noted below left hemidiaphragm. 2. Cardiomegaly. No pulmonary venous congestion. Thoracic aortic and upper extremity peripheral vascular disease. 3.  No acute pulmonary disease. 4. Degenerative change thoracic spine. No acute bony abnormalities identified. Electronically Signed   By: Maisie Fus  Register   On: Feb 05, 2019 16:30    Assessment and Plan:   1. 85 yo BF with catastrophic intracerebral hemorrhage.  2. Acute inferior STEMI noted by Ecg  Patient seen and examined and discussed with family. She is likely having an acute inferior STEMI although sometimes Ecg changes can be pretty marked with ICH. She is not a candidate for any reperfusion therapy since we cannot anticoagulate her or give antiplatelet therapy. Neurosurgery to see. Other than supportive care there is  nothing else we can offer from a cardiac standpoint. Prognosis is grim.   [facility] will sign off.   Medication Recommendations:  none Other recommendations (labs, testing, etc):  none Follow up as an outpatient:  N/A  For questions or updates, please contact [facility] Please consult [email or web address] for contact info under     Signed, Willisha Sligar [place], MD  02-05-19 5:28 PM

## 2019-01-29 NOTE — ED Triage Notes (Signed)
Pt BIB [facility] for eval of fall w/ AMS. Pt reportedly was taking out the trash cans, fell somehow (unk if mechanical vs physiologic). Pt was initially responsive for EMS, became unresponsive en route requiring BVM assistance. Arrives to ED GCS of 3 receiving BVM assisted ventilations. Pupils 2 and fixed.

## 2019-01-29 NOTE — ED Notes (Signed)
TOD 1730 pronounced by Antoine Primas MD

## 2019-01-29 NOTE — Progress Notes (Deleted)
Stroke Neurology Consultation Note  Consult Requested by: Dr. Rush Landmark   Reason for Consult: ICH with IVH  Consult Date: 01/21/2019  The history was obtained from the ED staff.  During history and examination, all items were not able to obtain unless otherwise noted.  History of Present Illness:  Sharon Coleman is a 85 y.o. African American female with PMH of cervical spinal stenosis was witness by neighbor outside beside trash can and had sudden fall, hitting her head. EMS called. On arrival, pt was awake and was able to talk but had rapid deterioration to repeat herself and became unresponsive and difficulty breathing. She was bagged on the way to the ER and was intubated in ER. Initially BP was high at 200s/100s, pupils pinpoint. Ct showed left cerebellar hemorrhage with extensive IVH and mild hydrocephalus. cleviprex started and on propofol. Pt was found to have posturing but then in deep coma, not responding at all. Eyes open and pupil 45mm fixed. No brainstem reflex. Propofol stopped. NSG consulted and plan for EVD placement. However, pt BP plunged to 80s and cleviprex stopped. EKG showed new STEMI, cardiology consulted.   LSN: around 3pm tPA Given: No: ICH with IVH  No past medical history on file.   The histories are not reviewed yet. Please review them in the "History" navigator section and refresh this SmartLink.  No family history on file.  Social History:  has no history on file for tobacco, alcohol, and drug.  Allergies: Allergies not on file  No current facility-administered medications on file prior to encounter.    No current outpatient medications on file prior to encounter.    Review of Systems: A full ROS was attempted today and was not able to be performed.    Physical Examination: Temp:  [96.5 F (35.8 C)] 96.5 F (35.8 C) (01/09 1535) Resp:  [16] 16 (01/09 1535) BP: (88)/(62) 88/62 (01/09 1535) SpO2:  [98 %-100 %] 100 % (01/09 1623) FiO2 (%):  [50 %-100  %] 50 % (01/09 1623) Weight:  [55 kg] 55 kg (01/09 1535)  General - thin built, well developed, intubated.    Ophthalmologic - fundi not visualized due to noncooperation.    Cardiovascular - regular rate and rhythm  Neuro - intubated, in deep coma, off propofol for 15 min, eyes open spontaneously, pupils 41mm, not reactive. No corneal bilaterally. No gag or cough. Not breathing over the vent. On pain stimulation, no movement of all extremities, but observed slight toe intermittent spontaneous movement at left without stimulation, not rhythmic. DTR diminished, no babinski.   Data Reviewed: Ct Head Wo Contrast  Result Date: 01-21-19 CLINICAL DATA:  Altered mental status, unwitnessed fall, trauma EXAM: CT HEAD WITHOUT CONTRAST CT CERVICAL SPINE WITHOUT CONTRAST TECHNIQUE: Multidetector CT imaging of the head and cervical spine was performed following the standard protocol without intravenous contrast. Multiplanar CT image reconstructions of the cervical spine were also generated. COMPARISON:  None. FINDINGS: CT HEAD FINDINGS Brain: There is a large volume of acute diffuse interventricular hemorrhage with mixed attenuation and associated mild obstructive hydrocephalus. There is an acute mixed attenuation left cerebellar intraparenchymal hemorrhage measuring 3.3 x 2.4 x 1.7 cm (6.7 cc). Mild brain atrophy noted. Chronic white matter microvascular ischemic changes throughout both cerebral hemispheres. Mass effect results in effacement of the basilar cisterns. No significant midline shift or herniation. No extra-axial fluid or subdural hemorrhage. Vascular: Intracranial atherosclerosis.  No hyperdense vessel. Skull: Normal. Negative for fracture or focal lesion. Sinuses/Orbits: No acute finding. Other:  None. CT CERVICAL SPINE FINDINGS Alignment: No subluxation or dislocation.  Preserved alignment. Skull base and vertebrae: Multilevel degenerative changes. No acute osseous finding or fracture. Soft tissues and  spinal canal: Acute left cerebellar and interventricular hemorrhage noted the fourth ventricle extending to the brainstem. No prevertebral soft tissue swelling. Disc levels: Multilevel degenerative disc disease, most severe at C4-5 and C5-6. Multilevel facet arthropathy posteriorly without subluxation dislocation. Degenerative changes of the C1-2 articulation. Upper chest: Mild apical emphysema changes. Endotracheal tube and NG tube noted. Atherosclerotic changes evident of the major branch vessels and carotid arteries. Other: None. IMPRESSION: Acute left cerebellar intraparenchymal hemorrhage measuring up to 3.3 cm with extension into the entire ventricular system. There is associated diffuse large volume interventricular hemorrhage with mild obstructive hydrocephalus. Background brain atrophy and chronic white matter microvascular changes Cervical degenerative changes without acute osseous finding or malalignment. These results were called by telephone at the time of interpretation on 01/23/2019 at 4:38 pm to Dr. Eric Wilson, who verbally acknowledged these results. Electronically Signed   By: M.  Shick M.D.   On: 01/08/2019 16:39   Ct Cervical Spine Wo Contrast  Result Date: 01/05/2019 CLINICAL DATA:  Altered mental status, unwitnessed fall, trauma EXAM: CT HEAD WITHOUT CONTRAST CT CERVICAL SPINE WITHOUT CONTRAST TECHNIQUE: Multidetector CT imaging of the head and cervical spine was performed following the standard protocol without intravenous contrast. Multiplanar CT image reconstructions of the cervical spine were also generated. COMPARISON:  None. FINDINGS: CT HEAD FINDINGS Brain: There is a large volume of acute diffuse interventricular hemorrhage with mixed attenuation and associated mild obstructive hydrocephalus. There is an acute mixed attenuation left cerebellar intraparenchymal hemorrhage measuring 3.3 x 2.4 x 1.7 cm (6.7 cc). Mild brain atrophy noted. Chronic white matter microvascular ischemic  changes throughout both cerebral hemispheres. Mass effect results in effacement of the basilar cisterns. No significant midline shift or herniation. No extra-axial fluid or subdural hemorrhage. Vascular: Intracranial atherosclerosis.  No hyperdense vessel. Skull: Normal. Negative for fracture or focal lesion. Sinuses/Orbits: No acute finding. Other: None. CT CERVICAL SPINE FINDINGS Alignment: No subluxation or dislocation.  Preserved alignment. Skull base and vertebrae: Multilevel degenerative changes. No acute osseous finding or fracture. Soft tissues and spinal canal: Acute left cerebellar and interventricular hemorrhage noted the fourth ventricle extending to the brainstem. No prevertebral soft tissue swelling. Disc levels: Multilevel degenerative disc disease, most severe at C4-5 and C5-6. Multilevel facet arthropathy posteriorly without subluxation dislocation. Degenerative changes of the C1-2 articulation. Upper chest: Mild apical emphysema changes. Endotracheal tube and NG tube noted. Atherosclerotic changes evident of the major branch vessels and carotid arteries. Other: None. IMPRESSION: Acute left cerebellar intraparenchymal hemorrhage measuring up to 3.3 cm with extension into the entire ventricular system. There is associated diffuse large volume interventricular hemorrhage with mild obstructive hydrocephalus. Background brain atrophy and chronic white matter microvascular changes Cervical degenerative changes without acute osseous finding or malalignment. These results were called by telephone at the time of interpretation on 01/15/2019 at 4:38 pm to Dr. Eric Wilson, who verbally acknowledged these results. Electronically Signed   By: M.  Shick M.D.   On: 01/07/2019 16:39   Dg Chest Port 1 View  Result Date: 01/01/2019 CLINICAL DATA:  Fall. EXAM: PORTABLE CHEST 1 VIEW COMPARISON:  No prior. FINDINGS: Endotracheal tube noted with its tip 2.5 cm above the carina. NG tube noted tip below left  hemidiaphragm. Mediastinum and hilar structures normal. Cardiomegaly. No pulmonary venous congestion. Aortic and upper extremity peripheral vascular disease. No focal alveolar   infiltrate. No pleural effusion or pneumothorax. Degenerative change thoracic spine. IMPRESSION: 1. Endotracheal tube tip noted 2.5 cm above the carina. NG tube tip noted below left hemidiaphragm. 2. Cardiomegaly. No pulmonary venous congestion. Thoracic aortic and upper extremity peripheral vascular disease. 3.  No acute pulmonary disease. 4. Degenerative change thoracic spine. No acute bony abnormalities identified. Electronically Signed   By: Maisie Fushomas  Register   On: 02-09-19 16:30    Assessment: 85 y.o. female with PMH of cervical spinal stenosis admitted after witness fall at outside home hitting head. CT showed left cerebellar hemorrhage with extensive IVH and mild obstructive hydrocephalus. BP initially high but then plunged and EKG showed STEMI. Currently, trauma, neurosurgery and cardiology services are involved. Initial plan for EVD however, current STEMI complicated the decision making.   Neurologically, pt shortly after propofol, but no any brainstem reflexes, no movement no pain but still has subtle toe spontaneous movement. Given current hydrocephalus, extensive IVH, cerebellar ICH, hypotension, STEMI, poor neuro exam, the prognosis would be extremely poor. Will need more family discussion and palliative care discussion also.   Plan: - treat for STEMI as per cardiology  - consider EVD by NSG if family would like aggressive care - BP goal < 140 - will need family discussion and palliative care involvement.  - will follow.   Thank you for this consultation and allowing us to participate in the care of this patient.  This patient is critically ill due to ICH and extensive IVH and hydrocephalus and STEMI and coma and at significant risk of neurological worsening, death form brain death, hydrocephalus, heart failure,  cardiogenic shock. This patient's care requires constant monitoring of vital signs, hemodynamics, respiratory and cardiac monitoring, review of multiple databases, neurological assessment, discussion with family, other specialists and medical decision making of high complexity. I spent 40 minutes of neurocritical care time in the care of this patient.  Marvel PlanJindong Corri Delapaz, MD PhD Stroke Neurology 01/07/2019 5:05 PM

## 2019-01-29 NOTE — ED Notes (Signed)
Noted marked elevation in V2 on bedside monitor, obtained 12 lead EKG and presented to MD [facility] immediately. CODE STEMI CALLED

## 2019-01-29 NOTE — Progress Notes (Signed)
   Jan 08, 2019 1800  Clinical Encounter Type  Visited With Patient not available;Family  Visit Type Initial;ED;Patient actively dying;Death;Critical Care  Referral From Chaplain;Nurse  Spiritual Encounters  Spiritual Needs Emotional;Grief support  CH relieved day CH in ED; [place] met family in consult room B and was present during MD medical update; [facility] liaison with healthcare team for family visits while patient actively dying and post-death; family at bedside and in consult room B.  Ch available as needed by family.

## 2019-01-29 NOTE — ED Notes (Signed)
Family at beside. Family given emotional support. 

## 2019-01-29 NOTE — ED Notes (Signed)
While MD [facility] at bedside speaking w/ family re: code status, pt went into ventricular fibrillation. Per family DO NOT RESUSCITATE. No ACLS interventions initiated at this time. Pt noted to still have rapid pulse w/ this rhythm. Family aware of prognosis.

## 2019-01-29 NOTE — Consult Note (Signed)
Activation and Reason: level 1 - witnessed fall with AMS  Primary Survey: no obvious traumatic injury, airway intact, bag-assisted respirations, bounding pulses  Sharon Coleman is an 85 y.o. female.  HPI: Patient is an 85 year old female brought in as a level 1 trauma after a neighbor witnessed her falling by her trash can and hitting her head. Patient was initially repetitive with EMS but then became less responsive. No personal details or medical history known. No obvious traumatic injuries. In ED pupils pinpoint and fixed, given one dose narcan in the ED. Intubated for airway protection by EDP. Initial manual blood pressure 88/60. Following BP measurements all noted to be in HTN urgency with systolic in the 200s and DBP in the 100s.   No past medical history on file.  No family history on file.  Social History:  has no history on file for tobacco, alcohol, and drug.  Allergies: Allergies not on file  Medications: N/A  Results for orders placed or performed during the hospital encounter of January 20, 2019 (from the past 48 hour(s))  Type and screen Ordered by PROVIDER DEFAULT     Status: None (Preliminary result)   Collection Time: January 20, 2019  3:29 PM  Result Value Ref Range   ABO/RH(D) O POS    Antibody Screen PENDING    Sample Expiration      01/09/2019 Performed at [facility], [address], [place], [place] [address]    Unit Number [id number]    Blood Component Type RED CELLS,LR    Unit division 00    Status of Unit ISSUED    Unit tag comment EMERGENCY RELEASE    Transfusion Status OK TO TRANSFUSE    Crossmatch Result PENDING    Unit Number [id number]    Blood Component Type RBC LR PHER1    Unit division 00    Status of Unit ISSUED    Unit tag comment EMERGENCY RELEASE    Transfusion Status OK TO TRANSFUSE    Crossmatch Result PENDING   I-Stat Troponin, ED (not at [facility])     Status: None   Collection Time: 01-20-19  3:44 PM  Result Value Ref Range   Troponin i, poc 0.01 0.00 - 0.08 ng/mL   Comment 3            Comment: Due to the release kinetics of cTnI, a negative result within the first hours of the onset of symptoms does not rule out myocardial infarction with certainty. If myocardial infarction is still suspected, repeat the test at appropriate intervals.   I-Stat Chem 8, ED     Status: Abnormal   Collection Time: Jan 20, 2019  3:45 PM  Result Value Ref Range   Sodium 138 135 - 145 mmol/L   Potassium 3.2 (L) 3.5 - 5.1 mmol/L   Chloride 100 98 - 111 mmol/L   BUN 18 8 - 23 mg/dL   Creatinine, Ser 8.93 (H) 0.44 - 1.00 mg/dL   Glucose, Bld 810 (H) 70 - 99 mg/dL   Calcium, Ion 1.75 1.02 - 1.40 mmol/L   TCO2 29 22 - 32 mmol/L   Hemoglobin 11.6 (L) 12.0 - 15.0 g/dL   HCT 58.5 (L) 27.7 - 82.4 %  I-Stat CG4 Lactic Acid, ED     Status: Abnormal   Collection Time: 20-Jan-2019  3:46 PM  Result Value Ref Range   Lactic Acid, Venous 2.64 (HH) 0.5 - 1.9 mmol/L   Comment NOTIFIED PHYSICIAN     No results found.  Review of Systems  Unable to perform ROS: Acuity of condition   Blood pressure (!) 88/62, temperature (!) 96.5 F (35.8 C), temperature source Temporal, resp. rate 16, height 4\' 11"  (1.499 m), weight 55 kg, SpO2 98 %. Physical Exam  Constitutional: She appears well-developed. She appears cachectic. Cervical collar, backboard and face mask in place.  HENT:  Head: Normocephalic and atraumatic.  Right Ear: External ear normal.  Left Ear: External ear normal.  Nose: Nose normal.  Mouth/Throat: She has dentures.  Eyes: Conjunctivae and lids are normal.  Pupils pinpoint and fixed bilaterally  Neck: Neck supple. No tracheal deviation present.  Cardiovascular: Regular rhythm. Tachycardia present.  Pulses:      Radial pulses are 3+ on the right side and 3+ on the left side.       Femoral pulses are 3+ on the right side and 3+ on the left side.      Dorsalis pedis pulses are 3+ on the right side and 3+ on the left side.    Respiratory: Breath sounds normal. No accessory muscle usage.  GI: Soft. Normal appearance and bowel sounds are normal. She exhibits no distension. There is no hepatosplenomegaly. There is no rigidity and no guarding.  Genitourinary:    Rectum normal.   Musculoskeletal:     Comments: No obvious deformities all 4 extremities  Neurological: She is unresponsive. GCS eye subscore is 1. GCS verbal subscore is 1. GCS motor subscore is 1.  GCS initially 3, later was moving legs bilaterally   Skin: Skin is warm, dry and intact.  Psychiatric:  Not assessable      Assessment/Plan: Fall  Hypertensive urgency Hemorrhagic stroke  Await final CT head read but appears patient likely had hemorrhagic stroke leading to fall. No other obvious traumatic injuries. We will follow up on CT results, but recommend admission to critical care with neurology/NS consults.    [place] 01/31/2019, 4:11 PM

## 2019-01-29 NOTE — Progress Notes (Signed)
Responded to level 1 page to support patient and staff.  Patient experienced a fall. Patient neighbor came to hospital with patient and said that the son of patient was somewhere here at [facility] in hospital because his wife is hospitalized. The neighbor is trying to reach patient son vis texts and calls.  Facilitated information sharing between patient neighbor and attending nurse. Chaplain available as needed.  Venida Jarvis, [place], [facility], Pager [phone]

## 2019-01-29 NOTE — ED Provider Notes (Signed)
[facility] Provider Note   CSN: [id number] Arrival date & time: 26-Jan-2019  1530     History   Chief Complaint Chief Complaint  Patient presents with  . Altered Mental Status    HPI [facility] is a 85 y.o. female.  The history is provided by the EMS personnel. No language interpreter was used.  Trauma   Protective equipment:       None      Suspicion of alcohol use: no      Suspicion of drug use: no  EMS/PTA data:      Bystander interventions: bystander C-spine precautions  Patient is a 85 year old female without significant known past medical history who presents via EMS after a witnessed fall earlier today.  Patient was reportedly taking her garbage out when neighbors witnessed her trip on the curb and hit her head.  Per EMS she was unresponsive on their arrival and required BVM breathing support in route.  No other medications were given in route and her patient reportedly had stable vitals in route.  Patient is unable to write any history secondary to being unresponsive.  No further history is available from EMS.  Past Medical History:  Diagnosis Date  . History of stomach ulcers   . Hypertension     Patient Active Problem List   Diagnosis Date Noted  . Intraparenchymal hemorrhage of brain (HCC)   . ST elevation myocardial infarction involving right coronary artery [facility])     Past Surgical History:  Procedure Laterality Date  . CARPAL TUNNEL RELEASE       OB History   No obstetric history on file.      Home Medications    Prior to Admission medications   Not on File    Family History History reviewed. No pertinent family history.  Social History Social History   Tobacco Use  . Smoking status: Never Smoker  . Smokeless tobacco: Never Used  Substance Use Topics  . Alcohol use: Not Currently    Frequency: Never  . Drug use: Never     Allergies   Asa [aspirin] and Penicillins   Review of  Systems Review of Systems  Unable to perform ROS: Acuity of condition     Physical Exam Updated Vital Signs BP (!) 83/52   Pulse 61   Temp (!) 96.5 F (35.8 C) (Temporal)   Resp (!) 0   Ht 4\' 11"  (1.499 m)   Wt 55 kg   SpO2 (!) 88%   BMI 24.49 kg/m   Physical Exam Vitals signs and nursing note reviewed.  Constitutional:      Appearance: She is normal weight.  HENT:     Head: Normocephalic and atraumatic.     Right Ear: External ear normal.     Left Ear: External ear normal.     Nose: Nose normal.     Mouth/Throat:     Mouth: Mucous membranes are moist.     Pharynx: Oropharynx is clear.  Eyes:     Conjunctiva/sclera: Conjunctivae normal.  Neck:     Musculoskeletal: No neck rigidity.  Cardiovascular:     Rate and Rhythm: Normal rate.     Pulses:          Radial pulses are 2+ on the right side and 2+ on the left side.       Dorsalis pedis pulses are 2+ on the right side and 2+ on the left side.  Pulmonary:  Breath sounds: Normal breath sounds.  Skin:    General: Skin is warm.     Capillary Refill: Capillary refill takes less than 2 seconds.  Neurological:     Mental Status: She is unresponsive.      ED Treatments / Results  Labs (all labs ordered are listed, but only abnormal results are displayed) Labs Reviewed  COMPREHENSIVE METABOLIC PANEL - Abnormal; Notable for the following components:      Result Value   Potassium 3.2 (*)    Glucose, Bld 167 (*)    Creatinine, Ser 1.64 (*)    GFR calc non Af Amer 28 (*)    GFR calc Af Amer 33 (*)    All other components within normal limits  CBC - Abnormal; Notable for the following components:   RBC 3.79 (*)    Hemoglobin 11.0 (*)    HCT 34.8 (*)    All other components within normal limits  I-STAT CHEM 8, ED - Abnormal; Notable for the following components:   Potassium 3.2 (*)    Creatinine, Ser 1.70 (*)    Glucose, Bld 169 (*)    Hemoglobin 11.6 (*)    HCT 34.0 (*)    All other components within  normal limits  I-STAT CG4 LACTIC ACID, ED - Abnormal; Notable for the following components:   Lactic Acid, Venous 2.64 (*)    All other components within normal limits  I-STAT ARTERIAL BLOOD GAS, ED - Abnormal; Notable for the following components:   pCO2 arterial 54.3 (*)    pO2, Arterial 628.0 (*)    Bicarbonate 31.5 (*)    TCO2 33 (*)    Acid-Base Excess 5.0 (*)    All other components within normal limits  ETHANOL  PROTIME-INR  CDS SEROLOGY  URINALYSIS, ROUTINE W REFLEX MICROSCOPIC  RAPID URINE DRUG SCREEN, HOSP PERFORMED  I-STAT TROPONIN, ED  TYPE AND SCREEN  ABO/RH  PREPARE FRESH FROZEN PLASMA    EKG EKG Interpretation  Date/Time:  Thursday January 06 2019 16:34:30 EST Ventricular Rate:  60 PR Interval:    QRS Duration: 95 QT Interval:  460 QTC Calculation: 460 R Axis:   90 Text Interpretation:  Sinus rhythm Prolonged PR interval Right atrial enlargement Probable LVH with secondary repol abnrm Inferior infarct, acute (RCA) Lateral leads are also involved Probable RV involvement, suggest recording right precordial leads >>> Acute MI <<< ** ** ACUTE MI / STEMI ** ** Confirmed by Theda Belfastegeler, Chris ([id number]) on 01/03/2019 4:49:49 PM   Radiology Ct Head Wo Contrast  Result Date: 01/05/2019 CLINICAL DATA:  Altered mental status, unwitnessed fall, trauma EXAM: CT HEAD WITHOUT CONTRAST CT CERVICAL SPINE WITHOUT CONTRAST TECHNIQUE: Multidetector CT imaging of the head and cervical spine was performed following the standard protocol without intravenous contrast. Multiplanar CT image reconstructions of the cervical spine were also generated. COMPARISON:  None. FINDINGS: CT HEAD FINDINGS Brain: There is a large volume of acute diffuse interventricular hemorrhage with mixed attenuation and associated mild obstructive hydrocephalus. There is an acute mixed attenuation left cerebellar intraparenchymal hemorrhage measuring 3.3 x 2.4 x 1.7 cm (6.7 cc). Mild brain atrophy noted. Chronic white  matter microvascular ischemic changes throughout both cerebral hemispheres. Mass effect results in effacement of the basilar cisterns. No significant midline shift or herniation. No extra-axial fluid or subdural hemorrhage. Vascular: Intracranial atherosclerosis.  No hyperdense vessel. Skull: Normal. Negative for fracture or focal lesion. Sinuses/Orbits: No acute finding. Other: None. CT CERVICAL SPINE FINDINGS Alignment: No subluxation or dislocation.  Preserved  alignment. Skull base and vertebrae: Multilevel degenerative changes. No acute osseous finding or fracture. Soft tissues and spinal canal: Acute left cerebellar and interventricular hemorrhage noted the fourth ventricle extending to the brainstem. No prevertebral soft tissue swelling. Disc levels: Multilevel degenerative disc disease, most severe at C4-5 and C5-6. Multilevel facet arthropathy posteriorly without subluxation dislocation. Degenerative changes of the C1-2 articulation. Upper chest: Mild apical emphysema changes. Endotracheal tube and NG tube noted. Atherosclerotic changes evident of the major branch vessels and carotid arteries. Other: None. IMPRESSION: Acute left cerebellar intraparenchymal hemorrhage measuring up to 3.3 cm with extension into the entire ventricular system. There is associated diffuse large volume interventricular hemorrhage with mild obstructive hydrocephalus. Background brain atrophy and chronic white matter microvascular changes Cervical degenerative changes without acute osseous finding or malalignment. These results were called by telephone at the time of interpretation on January 30, 2019 at 4:38 pm to Dr. Gaynelle Adu, who verbally acknowledged these results. Electronically Signed   By: Judie Petit.  Shick M.D.   On: Jan 30, 2019 16:39   Ct Cervical Spine Wo Contrast  Result Date: Jan 30, 2019 CLINICAL DATA:  Altered mental status, unwitnessed fall, trauma EXAM: CT HEAD WITHOUT CONTRAST CT CERVICAL SPINE WITHOUT CONTRAST TECHNIQUE:  Multidetector CT imaging of the head and cervical spine was performed following the standard protocol without intravenous contrast. Multiplanar CT image reconstructions of the cervical spine were also generated. COMPARISON:  None. FINDINGS: CT HEAD FINDINGS Brain: There is a large volume of acute diffuse interventricular hemorrhage with mixed attenuation and associated mild obstructive hydrocephalus. There is an acute mixed attenuation left cerebellar intraparenchymal hemorrhage measuring 3.3 x 2.4 x 1.7 cm (6.7 cc). Mild brain atrophy noted. Chronic white matter microvascular ischemic changes throughout both cerebral hemispheres. Mass effect results in effacement of the basilar cisterns. No significant midline shift or herniation. No extra-axial fluid or subdural hemorrhage. Vascular: Intracranial atherosclerosis.  No hyperdense vessel. Skull: Normal. Negative for fracture or focal lesion. Sinuses/Orbits: No acute finding. Other: None. CT CERVICAL SPINE FINDINGS Alignment: No subluxation or dislocation.  Preserved alignment. Skull base and vertebrae: Multilevel degenerative changes. No acute osseous finding or fracture. Soft tissues and spinal canal: Acute left cerebellar and interventricular hemorrhage noted the fourth ventricle extending to the brainstem. No prevertebral soft tissue swelling. Disc levels: Multilevel degenerative disc disease, most severe at C4-5 and C5-6. Multilevel facet arthropathy posteriorly without subluxation dislocation. Degenerative changes of the C1-2 articulation. Upper chest: Mild apical emphysema changes. Endotracheal tube and NG tube noted. Atherosclerotic changes evident of the major branch vessels and carotid arteries. Other: None. IMPRESSION: Acute left cerebellar intraparenchymal hemorrhage measuring up to 3.3 cm with extension into the entire ventricular system. There is associated diffuse large volume interventricular hemorrhage with mild obstructive hydrocephalus. Background  brain atrophy and chronic white matter microvascular changes Cervical degenerative changes without acute osseous finding or malalignment. These results were called by telephone at the time of interpretation on 2019/01/30 at 4:38 pm to Dr. Gaynelle Adu, who verbally acknowledged these results. Electronically Signed   By: Judie Petit.  Shick M.D.   On: 2019/01/30 16:39   Dg Chest Port 1 View  Result Date: 01-30-19 CLINICAL DATA:  Fall. EXAM: PORTABLE CHEST 1 VIEW COMPARISON:  No prior. FINDINGS: Endotracheal tube noted with its tip 2.5 cm above the carina. NG tube noted tip below left hemidiaphragm. Mediastinum and hilar structures normal. Cardiomegaly. No pulmonary venous congestion. Aortic and upper extremity peripheral vascular disease. No focal alveolar infiltrate. No pleural effusion or pneumothorax. Degenerative change thoracic spine. IMPRESSION: 1. Endotracheal  tube tip noted 2.5 cm above the carina. NG tube tip noted below left hemidiaphragm. 2. Cardiomegaly. No pulmonary venous congestion. Thoracic aortic and upper extremity peripheral vascular disease. 3.  No acute pulmonary disease. 4. Degenerative change thoracic spine. No acute bony abnormalities identified. Electronically Signed   By: Maisie Fus  Register   On: January 25, 2019 16:30    Procedures Procedure Name: Intubation Date/Time: 01/25/19 4:02 PM Performed by: Antoine Primas, MD Pre-anesthesia Checklist: Emergency Drugs available, Suction available, Patient being monitored and Patient identified Oxygen Delivery Method: Non-rebreather mask Preoxygenation: Pre-oxygenation with 100% oxygen Induction Type: Rapid sequence Ventilation: Mask ventilation without difficulty Laryngoscope Size: 3 Grade View: Grade IV Tube size: 7.5 mm Number of attempts: 1 Airway Equipment and Method: Stylet and Video-laryngoscopy Placement Confirmation: ETT inserted through vocal cords under direct vision,  Positive ETCO2,  CO2 detector and Breath sounds checked- equal and  bilateral Tube secured with: ETT holder      (including critical care time)  Medications Ordered in ED Medications  0.9 % NaCl with KCl 20 mEq/ L  infusion ( Intravenous Hold 01/25/2019 1728)  clevidipine (CLEVIPREX) infusion 0.5 mg/mL (0 mg/hr Intravenous Hold 25-Jan-2019 1704)  0.9 %  sodium chloride infusion (has no administration in time range)  etomidate (AMIDATE) injection (20 mg Intravenous Given 2019/01/25 1539)  0.9 %  sodium chloride infusion ( Intravenous Stopped Jan 25, 2019 1610)  succinylcholine (ANECTINE) injection (100 mg Intravenous Given 2019/01/25 1539)  naloxone (NARCAN) 2 MG/2ML injection (1 mg  Given 25-Jan-2019 1534)  propofol (DIPRIVAN) 1000 MG/100ML infusion ( Intravenous Stopped 01-25-19 1629)  fentaNYL (SUBLIMAZE) 100 MCG/2ML injection (50 mcg  Given 01/25/2019 1555)  fentaNYL (SUBLIMAZE) 100 MCG/2ML injection (50 mcg  Given January 25, 2019 1605)  0.9 %  sodium chloride infusion ( Intravenous Stopped 2019/01/25 1715)  0.9 %  sodium chloride infusion ( Intravenous Stopped 25-Jan-2019 1717)  atropine 1 MG/10ML injection (0.5 mg  Given 2019/01/25 1717)  atropine 1 MG/10ML injection (0.5 mg Intravenous Given January 25, 2019 1717)     Initial Impression / Assessment and Plan / ED Course  I have reviewed the triage vital signs and the nursing notes.  Pertinent labs & imaging results that were available during my care of the patient were reviewed by me and considered in my medical decision making (see chart for details).     Patient is an 85 year old female who presents above-stated history exam.  On presentation patient was noted to have a GCS of 3 with otherwise stable vital signs.  Patient was immediately intubated on arrival.  Please see above procedure note for details.  After obtaining IV access and expecting the patient for signs of external traumatic injury chest and pelvic x-rays were ordered.  A CT chest did not show any acute traumatic injuries in the chest but did show the ET tube approximately 2.5 cm above the  carina.  Patient was then sent to CT for CT head and neck.  The studies were remarkable for "Acute left cerebellar intraparenchymal hemorrhage measuring up to 3.3 cm with extension into the entire ventricular system. There is associated diffuse large volume interventricular hemorrhage with mild obstructive hydrocephalus. Background brain atrophy and chronic white matter microvascular Changes Cervical degenerative changes without acute osseous finding or Malalignment."  Neurology and neurosurgery were immediately called to the bedside.  While initial EKG did not show any acute ischemic findings and subsequent ECG did show findings consistent with STEMI.  A code STEMI was paged out.  By this time family had come to the patient's bedside  and expressed that the patient would want to be a DNR and would not want CPR if her heart were to stop.  Throughout this conversation the patiently subsequently became bradycardic to the 30s.  Patient was given atropine but was noted to transition to V. fib.  Shortly after this the patient became pulseless and was declared dead at approximately 5:30 PM.  I contacted the patient's primary care office and spoke with on-call physician who stated the patient's PCP Dr. Loleta ChanceHill would complete the patient's death certificate.  Final Clinical Impressions(s) / ED Diagnoses   Final diagnoses:  Altered mental status, unspecified altered mental status type  Intraparenchymal hemorrhage of brain (HCC)  Lactic acid acidosis  ST elevation myocardial infarction (STEMI), unspecified artery [facility](HCC)    ED Discharge Orders    None       Antoine PrimasSmith, Luetta Piazza, MD 2019-02-22 2044    Tegeler, Canary Brimhristopher J, MD 01/07/19 1151

## 2019-01-29 DEATH — deceased

## 2020-10-06 IMAGING — DX DG CHEST 1V PORT
1 series · 1 of 1 positions shown · non-contrast
Comparison: No prior.

CLINICAL DATA: Fall.

EXAM:
PORTABLE CHEST 1 VIEW

[chest]
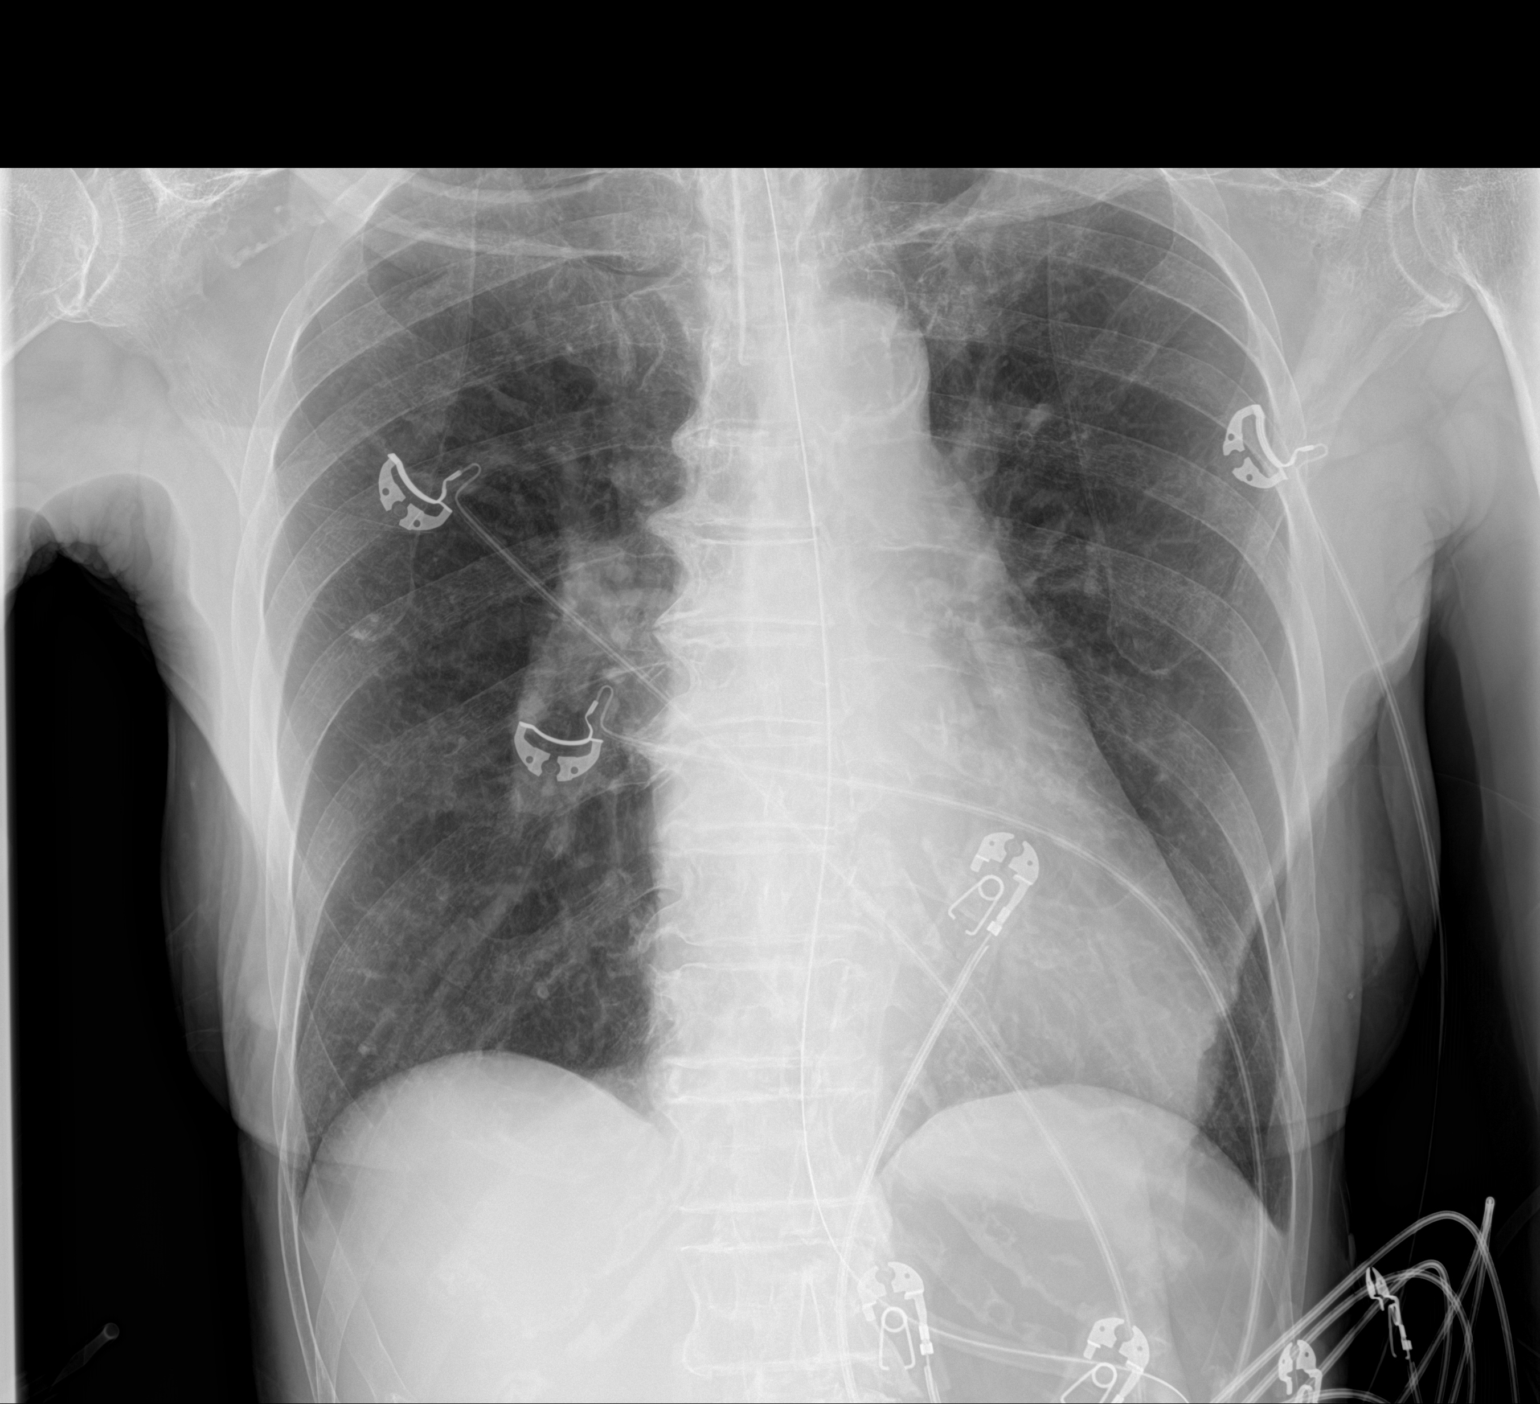

[1 of 1 positions shown; findings below may reference images not displayed]

FINDINGS: Endotracheal tube noted with its tip 2.5 cm above the carina. NG
tube noted tip below left hemidiaphragm. Mediastinum and hilar
structures normal. Cardiomegaly. No pulmonary venous congestion.
Aortic and upper extremity peripheral vascular disease. No focal
alveolar infiltrate. No pleural effusion or pneumothorax.
Degenerative change thoracic spine.
IMPRESSION: 1. Endotracheal tube tip noted 2.5 cm above the carina. NG tube tip
noted below left hemidiaphragm.

2. Cardiomegaly. No pulmonary venous congestion. Thoracic aortic and
upper extremity peripheral vascular disease.

3.  No acute pulmonary disease.

4. Degenerative change thoracic spine. No acute bony abnormalities
identified.
